# Patient Record
Sex: Female | Born: 1970
Health system: Southern US, Community
[De-identification: ages and names within clinical notes are randomized; demographics above are authoritative.]

## PROBLEM LIST (undated history)

## (undated) DIAGNOSIS — F909 Attention-deficit hyperactivity disorder, unspecified type: Secondary | ICD-10-CM

## (undated) DIAGNOSIS — K219 Gastro-esophageal reflux disease without esophagitis: Secondary | ICD-10-CM

## (undated) DIAGNOSIS — R5383 Other fatigue: Secondary | ICD-10-CM

## (undated) DIAGNOSIS — R002 Palpitations: Secondary | ICD-10-CM

## (undated) DIAGNOSIS — G43809 Other migraine, not intractable, without status migrainosus: Secondary | ICD-10-CM

## (undated) DIAGNOSIS — R569 Unspecified convulsions: Secondary | ICD-10-CM

## (undated) HISTORY — DX: Other fatigue: R53.83

## (undated) HISTORY — DX: Palpitations: R00.2

## (undated) HISTORY — DX: Attention-deficit hyperactivity disorder, unspecified type: F90.9

## (undated) HISTORY — DX: Gastro-esophageal reflux disease without esophagitis: K21.9

## (undated) HISTORY — DX: Unspecified convulsions: R56.9

---

## 1999-05-28 ENCOUNTER — Emergency Department (HOSPITAL_COMMUNITY): Admission: EM | Admit: 1999-05-28 | Discharge: 1999-05-28 | Payer: Self-pay | Admitting: *Deleted

## 2002-06-08 ENCOUNTER — Other Ambulatory Visit: Admission: RE | Admit: 2002-06-08 | Discharge: 2002-06-08 | Payer: Self-pay | Admitting: Obstetrics & Gynecology

## 2002-12-08 ENCOUNTER — Other Ambulatory Visit: Admission: RE | Admit: 2002-12-08 | Discharge: 2002-12-08 | Payer: Self-pay | Admitting: Obstetrics & Gynecology

## 2004-11-22 ENCOUNTER — Ambulatory Visit (HOSPITAL_COMMUNITY): Admission: RE | Admit: 2004-11-22 | Discharge: 2004-11-22 | Payer: Self-pay | Admitting: Family Medicine

## 2010-04-21 ENCOUNTER — Encounter: Payer: Self-pay | Admitting: Obstetrics & Gynecology

## 2010-04-21 ENCOUNTER — Encounter: Payer: Self-pay | Admitting: Family Medicine

## 2010-04-22 ENCOUNTER — Encounter: Payer: Self-pay | Admitting: Family Medicine

## 2010-04-29 ENCOUNTER — Inpatient Hospital Stay (HOSPITAL_COMMUNITY)
Admission: AD | Admit: 2010-04-29 | Discharge: 2010-04-29 | Payer: Self-pay | Source: Home / Self Care | Attending: Obstetrics and Gynecology | Admitting: Obstetrics and Gynecology

## 2010-05-01 ENCOUNTER — Inpatient Hospital Stay (HOSPITAL_COMMUNITY)
Admission: AD | Admit: 2010-05-01 | Discharge: 2010-05-04 | DRG: 774 | Disposition: A | Discharge status: Home / Self Care | Payer: Managed Care, Other (non HMO) | Source: Ambulatory Visit | Attending: Obstetrics & Gynecology | Admitting: Obstetrics & Gynecology

## 2010-05-01 DIAGNOSIS — O3660X0 Maternal care for excessive fetal growth, unspecified trimester, not applicable or unspecified: Secondary | ICD-10-CM | POA: Diagnosis present

## 2010-05-01 DIAGNOSIS — O9903 Anemia complicating the puerperium: Secondary | ICD-10-CM | POA: Diagnosis not present

## 2010-05-01 DIAGNOSIS — D62 Acute posthemorrhagic anemia: Secondary | ICD-10-CM | POA: Diagnosis not present

## 2010-05-01 DIAGNOSIS — O480 Post-term pregnancy: Principal | ICD-10-CM | POA: Diagnosis present

## 2010-05-01 LAB — CBC
HCT: 31.5 % — ABNORMAL LOW (ref 36.0–46.0)
MCH: 31.1 pg (ref 26.0–34.0)
MCV: 89 fL (ref 78.0–100.0)
WBC: 8.5 10*3/uL (ref 4.0–10.5)

## 2010-05-03 LAB — CBC
HCT: 19.3 % — ABNORMAL LOW (ref 36.0–46.0)
Hemoglobin: 6.6 g/dL — CL (ref 12.0–15.0)
Hemoglobin: 6.7 g/dL — CL (ref 12.0–15.0)
MCH: 31.3 pg (ref 26.0–34.0)
MCH: 31.8 pg (ref 26.0–34.0)
MCHC: 34.2 g/dL (ref 30.0–36.0)
MCHC: 34.4 g/dL (ref 30.0–36.0)
Platelets: 134 10*3/uL — ABNORMAL LOW (ref 150–400)
Platelets: 145 10*3/uL — ABNORMAL LOW (ref 150–400)
RBC: 2.11 MIL/uL — ABNORMAL LOW (ref 3.87–5.11)
RDW: 14.4 % (ref 11.5–15.5)
WBC: 12.3 10*3/uL — ABNORMAL HIGH (ref 4.0–10.5)

## 2010-05-04 LAB — CBC
Hemoglobin: 8 g/dL — ABNORMAL LOW (ref 12.0–15.0)
Platelets: 141 10*3/uL — ABNORMAL LOW (ref 150–400)
RDW: 16.1 % — ABNORMAL HIGH (ref 11.5–15.5)

## 2010-05-05 LAB — CROSSMATCH: Unit division: 0

## 2010-05-06 ENCOUNTER — Encounter (HOSPITAL_COMMUNITY)
Admission: RE | Admit: 2010-05-06 | Discharge: 2010-05-06 | Disposition: A | Discharge status: Home / Self Care | Payer: Self-pay | Source: Ambulatory Visit

## 2010-05-06 ENCOUNTER — Encounter (HOSPITAL_COMMUNITY)
Admission: RE | Admit: 2010-05-06 | Discharge: 2010-05-06 | Disposition: A | Discharge status: Home / Self Care | Payer: Self-pay | Source: Ambulatory Visit | Attending: Obstetrics & Gynecology | Admitting: Obstetrics & Gynecology

## 2010-05-06 DIAGNOSIS — O923 Agalactia: Secondary | ICD-10-CM | POA: Insufficient documentation

## 2010-05-10 ENCOUNTER — Ambulatory Visit (HOSPITAL_COMMUNITY): Payer: Managed Care, Other (non HMO) | Attending: Obstetrics & Gynecology

## 2012-04-23 ENCOUNTER — Other Ambulatory Visit: Payer: Self-pay | Admitting: Family Medicine

## 2012-04-23 ENCOUNTER — Ambulatory Visit
Admission: RE | Admit: 2012-04-23 | Discharge: 2012-04-23 | Disposition: A | Discharge status: Home / Self Care | Payer: Managed Care, Other (non HMO) | Source: Ambulatory Visit | Attending: Family Medicine | Admitting: Family Medicine

## 2012-04-23 DIAGNOSIS — M79605 Pain in left leg: Secondary | ICD-10-CM

## 2012-04-23 DIAGNOSIS — M25552 Pain in left hip: Secondary | ICD-10-CM

## 2012-04-23 DIAGNOSIS — M545 Low back pain: Secondary | ICD-10-CM

## 2013-09-03 ENCOUNTER — Emergency Department (HOSPITAL_COMMUNITY): Payer: Managed Care, Other (non HMO)

## 2013-09-03 ENCOUNTER — Encounter (HOSPITAL_COMMUNITY): Payer: Self-pay | Admitting: Emergency Medicine

## 2013-09-03 ENCOUNTER — Emergency Department (HOSPITAL_COMMUNITY)
Admission: EM | Admit: 2013-09-03 | Discharge: 2013-09-03 | Disposition: A | Discharge status: Home / Self Care | Payer: Managed Care, Other (non HMO) | Attending: Emergency Medicine | Admitting: Emergency Medicine

## 2013-09-03 DIAGNOSIS — R42 Dizziness and giddiness: Secondary | ICD-10-CM

## 2013-09-03 DIAGNOSIS — R112 Nausea with vomiting, unspecified: Secondary | ICD-10-CM | POA: Insufficient documentation

## 2013-09-03 DIAGNOSIS — R51 Headache: Secondary | ICD-10-CM | POA: Insufficient documentation

## 2013-09-03 DIAGNOSIS — J3489 Other specified disorders of nose and nasal sinuses: Secondary | ICD-10-CM | POA: Insufficient documentation

## 2013-09-03 DIAGNOSIS — R55 Syncope and collapse: Secondary | ICD-10-CM | POA: Insufficient documentation

## 2013-09-03 DIAGNOSIS — R1110 Vomiting, unspecified: Secondary | ICD-10-CM

## 2013-09-03 DIAGNOSIS — Z3202 Encounter for pregnancy test, result negative: Secondary | ICD-10-CM | POA: Insufficient documentation

## 2013-09-03 DIAGNOSIS — R404 Transient alteration of awareness: Secondary | ICD-10-CM | POA: Insufficient documentation

## 2013-09-03 DIAGNOSIS — Z8679 Personal history of other diseases of the circulatory system: Secondary | ICD-10-CM | POA: Insufficient documentation

## 2013-09-03 HISTORY — DX: Other migraine, not intractable, without status migrainosus: G43.809

## 2013-09-03 LAB — COMPREHENSIVE METABOLIC PANEL
ALK PHOS: 59 U/L (ref 39–117)
ALT: 40 U/L — ABNORMAL HIGH (ref 0–35)
AST: 38 U/L — ABNORMAL HIGH (ref 0–37)
Albumin: 3.8 g/dL (ref 3.5–5.2)
BUN: 7 mg/dL (ref 6–23)
CALCIUM: 9.6 mg/dL (ref 8.4–10.5)
CO2: 27 mEq/L (ref 19–32)
CREATININE: 0.59 mg/dL (ref 0.50–1.10)
Chloride: 99 mEq/L (ref 96–112)
GFR calc non Af Amer: >90 mL/min (ref >90)
GLUCOSE: 106 mg/dL — AB (ref 70–99)
Potassium: 3.8 mEq/L (ref 3.7–5.3)
SODIUM: 138 meq/L (ref 137–147)
TOTAL PROTEIN: 7.8 g/dL (ref 6.0–8.3)
Total Bilirubin: 0.3 mg/dL (ref 0.3–1.2)

## 2013-09-03 LAB — CBC WITH DIFFERENTIAL/PLATELET
BASOS PCT: 1 % (ref 0–1)
Basophils Absolute: 0.1 10*3/uL (ref 0.0–0.1)
EOS PCT: 1 % (ref 0–5)
Eosinophils Absolute: 0.1 10*3/uL (ref 0.0–0.7)
HEMATOCRIT: 36.1 % (ref 36.0–46.0)
Hemoglobin: 12 g/dL (ref 12.0–15.0)
LYMPHS ABS: 3 10*3/uL (ref 0.7–4.0)
Lymphocytes Relative: 35 % (ref 12–46)
MCH: 28.9 pg (ref 26.0–34.0)
MCHC: 33.2 g/dL (ref 30.0–36.0)
MCV: 87 fL (ref 78.0–100.0)
Monocytes Absolute: 0.6 10*3/uL (ref 0.1–1.0)
Monocytes Relative: 7 % (ref 3–12)
Neutro Abs: 4.8 10*3/uL (ref 1.7–7.7)
Neutrophils Relative %: 56 % (ref 43–77)
PLATELETS: 250 10*3/uL (ref 150–400)
RBC: 4.15 MIL/uL (ref 3.87–5.11)
RDW: 13.8 % (ref 11.5–15.5)
WBC: 8.6 10*3/uL (ref 4.0–10.5)

## 2013-09-03 LAB — TROPONIN I: Troponin I: <0.3 ng/mL (ref <0.30)

## 2013-09-03 LAB — POC URINE PREG, ED: PREG TEST UR: NEGATIVE

## 2013-09-03 MED ORDER — ONDANSETRON 4 MG PO TBDP: ORAL_TABLET | ORAL | Status: DC

## 2013-09-03 MED ORDER — MECLIZINE HCL 25 MG PO TABS: 25.0000 mg | ORAL_TABLET | Freq: Three times a day (TID) | ORAL | Status: DC | PRN

## 2013-09-03 MED ORDER — ONDANSETRON HCL 4 MG/2ML IJ SOLN
4.0000 mg | Freq: Once | INTRAMUSCULAR | Status: AC
  Administered 2013-09-03: 4 mg via INTRAVENOUS
  Filled 2013-09-03: qty 2

## 2013-09-03 MED ORDER — SODIUM CHLORIDE 0.9 % IV BOLUS (SEPSIS)
1000.0000 mL | INTRAVENOUS | Status: AC
  Administered 2013-09-03: 1000 mL via INTRAVENOUS

## 2013-09-03 NOTE — ED Notes (Signed)
Patient vomiting as she came into exam room with husband.  IV started and fluids hanging.  Dr. Romeo Apple at bedside.  Labs obtained.

## 2013-09-03 NOTE — ED Notes (Signed)
Patient has extreme Ha and Lightheadedness. The patient went to dr. The patient MHA and sinus infection. The patient now is worse.

## 2013-09-03 NOTE — ED Provider Notes (Signed)
CSN: [id number]     Arrival date & time 09/03/13  1254 History   First MD Initiated Contact with Patient 09/03/13 1322     Chief Complaint  Patient presents with  . Dizziness  . Loss of Consciousness     (Consider location/radiation/quality/duration/timing/severity/associated sxs/prior Treatment) Patient is a 43 y.o. female presenting with dizziness and syncope. The history is provided by the patient.  Dizziness Quality:  Unable to specify Severity:  Moderate Onset quality:  Sudden Timing:  Rare Progression:  Unchanged Chronicity:  New Context comment:  Worse w/ standing Relieved by:  Lying down and being still Worsened by:  Standing up Ineffective treatments:  None tried Associated symptoms: headaches, nausea, syncope and vomiting   Associated symptoms: no chest pain, no diarrhea and no shortness of breath   Loss of Consciousness Associated symptoms: dizziness, headaches, nausea and vomiting   Associated symptoms: no chest pain, no fever and no shortness of breath     Past Medical History  Diagnosis Date  . Lower half migraine    History reviewed. No pertinent past surgical history. No family history on file. History  Substance Use Topics  . Smoking status: Never Smoker   . Smokeless tobacco: Not on file  . Alcohol Use: No   OB History   Grav Para Term Preterm Abortions TAB SAB Ect Mult Living                 Review of Systems  Constitutional: Negative for fever and fatigue.  HENT: Positive for congestion. Negative for drooling.   Eyes: Negative for pain.  Respiratory: Negative for cough and shortness of breath.   Cardiovascular: Positive for syncope. Negative for chest pain.  Gastrointestinal: Positive for nausea and vomiting. Negative for abdominal pain and diarrhea.  Genitourinary: Negative for dysuria and hematuria.  Musculoskeletal: Negative for back pain, gait problem and neck pain.  Skin: Negative for color change.  Neurological: Positive for dizziness  and headaches.  Hematological: Negative for adenopathy.  Psychiatric/Behavioral: Negative for behavioral problems.  All other systems reviewed and are negative.     Allergies  Review of patient's allergies indicates no known allergies.  Home Medications   Prior to Admission medications   Not on File   BP 105/65  Pulse 61  Temp(Src) 97.8 F (36.6 C)  Resp 20  SpO2 100% Physical Exam  Nursing note and vitals reviewed. Constitutional: She is oriented to person, place, and time. She appears well-developed and well-nourished.  HENT:  Head: Normocephalic.  Mouth/Throat: Oropharynx is clear and moist. No oropharyngeal exudate.  Eyes: Conjunctivae and EOM are normal. Pupils are equal, round, and reactive to light.  Neck: Normal range of motion. Neck supple.  Cardiovascular: Normal rate, regular rhythm, normal heart sounds and intact distal pulses.  Exam reveals no gallop and no friction rub.   No murmur heard. Pulmonary/Chest: Effort normal and breath sounds normal. No respiratory distress. She has no wheezes.  Abdominal: Soft. Bowel sounds are normal. There is no tenderness. There is no rebound and no guarding.  Musculoskeletal: Normal range of motion. She exhibits no edema and no tenderness.  Neurological: She is alert and oriented to person, place, and time.  alert, oriented x3 speech: normal in context and clarity memory: intact grossly cranial nerves II-XII: intact motor strength: full proximally and distally no involuntary movements or tremors sensation: intact to light touch diffusely  cerebellar: finger-to-nose and heel-to-shin intact gait: normal forwards and backwards, normal tandem gait   Skin: Skin is warm  and dry.  Psychiatric: She has a normal mood and affect. Her behavior is normal.    ED Course  Procedures (including critical care time) Labs Review Labs Reviewed  COMPREHENSIVE METABOLIC PANEL - Abnormal; Notable for the following:    Glucose, Bld 106 (*)     AST 38 (*)    ALT 40 (*)    All other components within normal limits  CBC WITH DIFFERENTIAL  TROPONIN I  POC URINE PREG, ED    Imaging Review Dg Chest 2 View  09/03/2013   CLINICAL DATA:  Syncope.  EXAM: CHEST  2 VIEW  COMPARISON:  None.  FINDINGS: The heart size and mediastinal contours are within normal limits. Both lungs are clear. The visualized skeletal structures are unremarkable.  IMPRESSION: No active cardiopulmonary disease.   Electronically Signed   By: Myles RosenthalJohn  Stahl M.D.   On: 09/03/2013 14:45   Ct Head Wo Contrast  09/03/2013   CLINICAL DATA:  Dizziness and loss of consciousness ; headache  EXAM: CT HEAD WITHOUT CONTRAST  TECHNIQUE: Contiguous axial images were obtained from the base of the skull through the vertex without intravenous contrast. Study was obtained within 24 hr of patient's arrival at the emergency department.  COMPARISON:  None.  FINDINGS: The ventricles are normal in size and configuration. There is no mass, hemorrhage, extra-axial fluid collection, or midline shift. There is no gray-white compartment lesion apparent. No acute infarct apparent. The bony calvarium appears intact. The mastoid air cells are clear.  IMPRESSION: Study within normal limits.   Electronically Signed   By: Bretta BangWilliam  Woodruff M.D.   On: 09/03/2013 14:43     EKG Interpretation   Date/Time:  Saturday September 03 2013 14:11:17 EDT Ventricular Rate:  68 PR Interval:  176 QRS Duration: 84 QT Interval:  397 QTC Calculation: 422 R Axis:   78 Text Interpretation:  Sinus rhythm inverted t wave in V2 which is  non-specific Confirmed by Harlow Basley  MD, Carlous Olivares ([id number]) on 09/03/2013 2:20:59  PM      MDM   Final diagnoses:  Near syncope  Vomiting  Dizziness    2:15 PM 43 y.o. female who presents with a near syncopal episode which occurred at approximately 9 AM this morning. She states that she developed a subjective fever and headache last weekend (7 days ago). She notes that she has a history of  headaches and this was consistent with previous headaches. She states she had a temperature of 99. She also had some nasal congestion. She saw her primary care provider for 5 days ago who diagnosed her with sinusitis and started her on amoxicillin as well as an antihistamine. She notes that she had started to feel better over the last few days. Last night she had a few episodes of dizziness and emesis which recurred this morning. She notes that she has had intermittent episodes of dizziness and nausea over the last few months. She called out for her husband this morning who witnessed her have a near syncopal episode lasting only 1-2 minutes. The patient is afebrile and vital signs are unremarkable here. She denies any dizziness currently but states standing makes the dizziness slightly worse. She has a normal neurologic exam. Will get screening labs and imaging. Will get CT of head do to intermittent dizziness over the last few months. My suspicion is that it is likely vertigo of a peripheral etiology.   3:24 PM: Pt continues to appear well. I interpreted/reviewed the labs and/or imaging which were non-contributory.  Will provide antivert and zofran. I have discussed the diagnosis/risks/treatment options with the patient and believe the pt to be eligible for discharge home to follow-up with pcp as needed, also ENT non-emergently d/t intermittent dizziness. We also discussed returning to the ED immediately if new or worsening sx occur. We discussed the sx which are most concerning (e.g., worsening dizziness, inability to tolerate po, fever) that necessitate immediate return. Medications administered to the patient during their visit and any new prescriptions provided to the patient are listed below.  Medications given during this visit Medications  sodium chloride 0.9 % bolus 1,000 mL (1,000 mLs Intravenous New Bag/Given 09/03/13 1414)  ondansetron (ZOFRAN) injection 4 mg (4 mg Intravenous Given 09/03/13 1359)     New Prescriptions   MECLIZINE (ANTIVERT) 25 MG TABLET    Take 1 tablet (25 mg total) by mouth 3 (three) times daily as needed for dizziness.   ONDANSETRON (ZOFRAN ODT) 4 MG DISINTEGRATING TABLET    4mg  ODT q4 hours prn nausea/vomit       Junius Argyle, MD 09/03/13 1526

## 2013-09-03 NOTE — Discharge Instructions (Signed)
Dizziness ° Dizziness means you feel unsteady or lightheaded. You might feel like you are going to pass out (faint). °HOME CARE  °· Drink enough fluids to keep your pee (urine) clear or pale yellow. °· Take your medicines exactly as told by your doctor. If you take blood pressure medicine, always stand up slowly from the lying or sitting position. Hold on to something to steady yourself. °· If you need to stand in one place for a long time, move your legs often. Tighten and relax your leg muscles. °· Have someone stay with you until you feel okay. °· Do not drive or use heavy machinery if you feel dizzy. °· Do not drink alcohol. °GET HELP RIGHT AWAY IF:  °· You feel dizzy or lightheaded and it gets worse. °· You feel sick to your stomach (nauseous), or you throw up (vomit). °· You have trouble talking or walking. °· You feel weak or have trouble using your arms, hands, or legs. °· You cannot think clearly or have trouble forming sentences. °· You have chest pain, belly (abdominal) pain, sweating, or you are short of breath. °· Your vision changes. °· You are bleeding. °· You have problems from your medicine that seem to be getting worse. °MAKE SURE YOU:  °· Understand these instructions. °· Will watch your condition. °· Will get help right away if you are not doing well or get worse. °Document Released: 03/06/2011 Document Revised: 06/09/2011 Document Reviewed: 03/06/2011 °ExitCare® Patient Information ©2014 [employer]. ° °

## 2013-10-19 ENCOUNTER — Ambulatory Visit (INDEPENDENT_AMBULATORY_CARE_PROVIDER_SITE_OTHER): Payer: Managed Care, Other (non HMO) | Admitting: Neurology

## 2013-10-19 ENCOUNTER — Encounter: Payer: Self-pay | Admitting: Neurology

## 2013-10-19 VITALS — BP 86/61 | HR 71 | Ht 63.5 in | Wt 143.6 lb

## 2013-10-19 DIAGNOSIS — Q048 Other specified congenital malformations of brain: Secondary | ICD-10-CM

## 2013-10-19 DIAGNOSIS — G40219 Localization-related (focal) (partial) symptomatic epilepsy and epileptic syndromes with complex partial seizures, intractable, without status epilepticus: Secondary | ICD-10-CM

## 2013-10-19 DIAGNOSIS — R443 Hallucinations, unspecified: Secondary | ICD-10-CM

## 2013-10-19 DIAGNOSIS — G40909 Epilepsy, unspecified, not intractable, without status epilepticus: Secondary | ICD-10-CM | POA: Insufficient documentation

## 2013-10-19 DIAGNOSIS — F22 Delusional disorders: Secondary | ICD-10-CM

## 2013-10-19 DIAGNOSIS — R440 Auditory hallucinations: Secondary | ICD-10-CM | POA: Insufficient documentation

## 2013-10-19 MED ORDER — LEVETIRACETAM 500 MG PO TABS: 500.0000 mg | ORAL_TABLET | Freq: Two times a day (BID) | ORAL | Status: DC

## 2013-10-19 NOTE — Patient Instructions (Signed)
I had a long discussion with the patient and his wife regarding her recent protracted illness, seizures and personally reviewed imaging studies and answered questions. Continue Keppra for seizures and taper Trileptal because of low sodium as well as paranoid ideation. Repeat MRI scan of the brain with and without contrast and EEG. She was advised not to drive for now. Return for followup in 2 months or call earlier if necessary.  Seizure, Adult A seizure is abnormal electrical activity in the brain. Seizures usually last from 30 seconds to 2 minutes. There are various types of seizures. Before a seizure, you may have a warning sensation (aura) that a seizure is about to occur. An aura may include the following symptoms:   Fear or anxiety.  Nausea.  Feeling like the room is spinning (vertigo).  Vision changes, such as seeing flashing lights or spots. Common symptoms during a seizure include:  A change in attention or behavior (altered mental status).  Convulsions with rhythmic jerking movements.  Drooling.  Rapid eye movements.  Grunting.  Loss of bladder and bowel control.  Bitter taste in the mouth.  Tongue biting. After a seizure, you may feel confused and sleepy. You may also have an injury resulting from convulsions during the seizure. HOME CARE INSTRUCTIONS   If you are given medicines, take them exactly as prescribed by your health care provider.  Keep all follow-up appointments as directed by your health care provider.  Do not swim or drive or engage in risky activity during which a seizure could cause further injury to you or others until your health care provider says it is OK.  Get adequate rest.  Teach friends and family what to do if you have a seizure. They should:  Lay you on the ground to prevent a fall.  Put a cushion under your head.  Loosen any tight clothing around your neck.  Turn you on your side. If vomiting occurs, this helps keep your airway  clear.  Stay with you until you recover.  Know whether or not you need emergency care. SEEK IMMEDIATE MEDICAL CARE IF:  The seizure lasts longer than 5 minutes.  The seizure is severe or you do not wake up immediately after the seizure.  You have an altered mental status after the seizure.  You are having more frequent or worsening seizures. Someone should drive you to the emergency department or call local emergency services (911 in [place] MAKE SURE YOU:  Understand these instructions.  Will watch your condition.  Will get help right away if you are not doing well or get worse. Document Released: 03/14/2000 Document Revised: 01/05/2013 Document Reviewed: 10/27/2012 [facility] LtdExitCare Patient Information 2015 [employer], [place]. This information is not intended to replace advice given to you by your health care provider. Make sure you discuss any questions you have with your health care provider.

## 2013-10-19 NOTE — Progress Notes (Signed)
[facility] [address] [place]. [place] [address] [phone]       OFFICE CONSULT NOTE  [name] Date of Birth:  1970/04/20 Medical Record Number:  [id number]   Referring MD:  Farris Has  Reason for Referral:  seizures  HPI: 29 year [place] lady who recently returned from [place] where she had  hospitalization for a repetitive seizures and abnormal MRI scan of the brain. She is accompanied today by husband and has brought with her extensive hospital records, several MRI scans and lab results which I have  personally reviewed. She started feeling sick   actually the first week of June 2015 several days prior to going to [place]. She was felt to have a sinus infection and treated with Augmentin for sinusitis and Zofran for nausea. She felt tired after this and the day prior to her trip her husband noted that she looked pale and had head was turned to one side and she'll not fully responsive. EMS were called and patient vomited. She was taken to the long emergency room where she was felt to have near syncope and dehydration. She was treated with IV fluids. CT scan of the head, x-rays EKG and lab work were unremarkable. She was discharged on and felt better next morning after taking Sudafed. She traveled to [place] up with felt unwell. She started hearing voices in her head which were talking to each other. She is more able to concentrate and spent time with relatives. She felt better after she rested. Several days later after she had stayed up late in the night the husband noticed that she will have a generalized clonic tonic seizure. She was noted as morning with tossing and turning with teeth clenching. She was taken to the  [facility] hospital where she was diagnosed having seizures and treated with diazepam. She fell asleep. Next-day she was asked to see a neurologist who advised an MRI scan and EEG. She started having speech difficulties and  stopping midsentence    and unable to complete sentences. The patient's husband [name] L4 medial which have looked at in which patient is making vocalizations but not able to form words. Patient was subsequently hospitalized and underwent a spinal tap which was reportedly normal and an MRI scan of the brain which showed weak diffusion positive lesion in the left medial frontal lobe cingulate gyrus. There were no ADC map images.This lesion was also seen on T2 and flair images and   a postcontrast MRI showed a venous angioma adjacent to this lesion. Lesion was felt to represent cortical dysplasia versus postictal changes from repititive seizures.. The patient was initially started on Trileptal and phenytoin but continued to have auditory hallucinations and some speech and word finding difficulties but eventually she was switched to Keppra and she had a miraculous response to this and voices in her head stopped and her speech improved and she's not had any further seizures since then . She however has had some paranoid ideation and is having thoughts of fears of separation from her family and thoughts like ironing clothes we'll start a fire. She is currently taking Keppra 500 twice daily and Trileptal 300 twice daily. She had lab work done on 10/18/13 which showed low sodium of 133 but normal WBC and electrolytes and liver functions otherwise.  ROS:   14 system review of systems is positive for fatigue, ringing in ears, spinning sensation, birthmarks, blurred vision, feeling hot and cold, confusion, memory loss, weakness, slurred  speech, dizziness, depression, anxiety, disinterest in activities, hallucinations.  PMH:  Past Medical History  Diagnosis Date  . Lower half migraine   . Seizures     Social History:  History   Social History  . Marital Status: Married    Spouse Name: N/A    Number of Children: 2  . Years of Education: degree   Occupational History  . school    Social History Main Topics  . Smoking status:  Never Smoker   . Smokeless tobacco: Not on file  . Alcohol Use: No  . Drug Use: No  . Sexual Activity: Yes   Other Topics Concern  . Not on file   Social History Narrative   Patient lives with her husband and 2 children   Patient is right handed    Patient drunks tea    Medications:   No current outpatient prescriptions on file prior to visit.   No current facility-administered medications on file prior to visit.    Allergies:   Allergies  Allergen Reactions  . Levaquin [Levofloxacin] Nausea And Vomiting  . Nexium [Esomeprazole] Nausea And Vomiting    Dizziness.     Physical Exam General: well developed, well nourished young [place] lady, seated, in no evident distress Head: head normocephalic and atraumatic. Orohparynx benign Neck: supple with no carotid or supraclavicular bruits Cardiovascular: regular rate and rhythm, no murmurs Musculoskeletal: no deformity Skin:  no rash/petichiae Vascular:  Normal pulses all extremities Filed Vitals:   10/19/13 1347  BP: 86/61  Pulse: 71    Neurologic Exam Mental Status: Awake and fully alert. Oriented to place and time. Recent and remote memory intact. Recall 3/3. Intact calculation. Attention span, concentration and fund of knowledge appropriate. Mood and affect appropriate. No paranoid ideation noted Cranial Nerves: Fundoscopic exam reveals sharp disc margins. Pupils equal, briskly reactive to light. Extraocular movements full without nystagmus. Visual fields full to confrontation. Hearing intact. Facial sensation intact. Face, tongue, palate moves normally and symmetrically.  Motor: Normal bulk and tone. Normal strength in all tested extremity muscles. Sensory.: intact to tough and pinprick and vibratory.  Coordination: Rapid alternating movements normal in all extremities. Finger-to-nose and heel-to-shin performed accurately bilaterally. Gait and Station: Arises from chair without difficulty. Stance is normal.  Gait demonstrates normal stride length and balance . Able to heel, toe and tandem walk without difficulty.  Reflexes: 1+ and symmetric. Toes downgoing.      ASSESSMENT: 64 year [place] lady with recent episodes of complex partial, simple partial and partial onset seizures with secondary generalization in June 2015 symptomatic from left frontal lesion etiology indeterminate. 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 Seizures seem now controlled but she is having some paranoid ideation and low sodium levels which may be related to Trileptal    PLAN: I had a long discussion with the patient and his wife regarding her recent protracted illness, seizures and personally reviewed imaging studies and answered questions. Continue Keppra for seizures and taper Trileptal because of low sodium as well as paranoid ideation. Repeat MRI scan of the brain with and without contrast and EEG. She was advised not to drive for now. Return for followup in 2 months or call earlier if necessary. The duration of this appointment visit was 60 minutes of face-to-face time with the patient. Greater than 50% of this time was spent in counseling, explanation of diagnosis,extensive review of available records, imaging studies, planning of further  management, and coordination of care.      Note: This document was prepared with digital dictation and possible smart phrase technology. Any transcriptional errors that result from this process are unintentional.

## 2013-11-08 ENCOUNTER — Telehealth: Payer: Self-pay | Admitting: Neurology

## 2013-11-08 NOTE — Telephone Encounter (Signed)
Patient calling to get clarification about whether she needs to have her MRI sooner or if she can wait, please return call to patient and advise.

## 2013-11-09 ENCOUNTER — Telehealth: Payer: Self-pay | Admitting: Neurology

## 2013-11-09 NOTE — Telephone Encounter (Signed)
Called patient and relayed with consult from Dr. Roda ShuttersXu on how to come down off the medication the patient was on. Trileptal and i also told them to have the mri done in to the middle of september

## 2013-11-09 NOTE — Telephone Encounter (Signed)
Andrea Berg calling to state that she wants a call to discuss whether she can wait to have her MRI closer to her follow up on 12/30/13 or if Dr. Pearlean BrownieSethi wants her to have it as soon as possible. Please return call to Andrea Berg and advise.

## 2013-11-09 NOTE — Telephone Encounter (Signed)
MRI has been authorized. Renee left message for patient to call to schedule.

## 2013-12-07 ENCOUNTER — Other Ambulatory Visit: Payer: Managed Care, Other (non HMO)

## 2013-12-08 ENCOUNTER — Encounter (INDEPENDENT_AMBULATORY_CARE_PROVIDER_SITE_OTHER): Payer: Self-pay

## 2013-12-08 ENCOUNTER — Ambulatory Visit (INDEPENDENT_AMBULATORY_CARE_PROVIDER_SITE_OTHER): Payer: Managed Care, Other (non HMO)

## 2013-12-08 DIAGNOSIS — G40219 Localization-related (focal) (partial) symptomatic epilepsy and epileptic syndromes with complex partial seizures, intractable, without status epilepticus: Secondary | ICD-10-CM

## 2013-12-08 MED ORDER — GADOPENTETATE DIMEGLUMINE 469.01 MG/ML IV SOLN: 14.0000 mL | Freq: Once | INTRAVENOUS | Status: AC | PRN

## 2013-12-22 ENCOUNTER — Telehealth: Payer: Self-pay | Admitting: Neurology

## 2013-12-22 NOTE — Telephone Encounter (Signed)
MRI shows no major findings. Probably stable DVA. -VRP

## 2013-12-22 NOTE — Telephone Encounter (Signed)
Please call back with MRI results...best contact is [phone] (M)

## 2013-12-22 NOTE — Telephone Encounter (Signed)
Patient calling for MRI results.  Please call @ [phone] anytime and may leave detailed message on voicemail.

## 2013-12-22 NOTE — Telephone Encounter (Signed)
I called and left a message on the patient's husband's cell phone regarding stable appearance of followup MRI scan and to keep scheduled followup appointment with me

## 2013-12-26 ENCOUNTER — Other Ambulatory Visit: Payer: Managed Care, Other (non HMO) | Admitting: Radiology

## 2013-12-28 ENCOUNTER — Other Ambulatory Visit (INDEPENDENT_AMBULATORY_CARE_PROVIDER_SITE_OTHER): Payer: Managed Care, Other (non HMO) | Admitting: Radiology

## 2013-12-28 DIAGNOSIS — G40219 Localization-related (focal) (partial) symptomatic epilepsy and epileptic syndromes with complex partial seizures, intractable, without status epilepticus: Secondary | ICD-10-CM

## 2013-12-30 ENCOUNTER — Encounter: Payer: Self-pay | Admitting: Neurology

## 2013-12-30 ENCOUNTER — Ambulatory Visit (INDEPENDENT_AMBULATORY_CARE_PROVIDER_SITE_OTHER): Payer: Managed Care, Other (non HMO) | Admitting: Neurology

## 2013-12-30 VITALS — BP 100/60 | HR 76 | Ht 63.5 in | Wt 147.2 lb

## 2013-12-30 DIAGNOSIS — G40109 Localization-related (focal) (partial) symptomatic epilepsy and epileptic syndromes with simple partial seizures, not intractable, without status epilepticus: Secondary | ICD-10-CM

## 2013-12-30 NOTE — Progress Notes (Signed)
[facility] [address] [place] [place] [address] [phone]       OFFICE FOLLOW UP VISIT NOTE  [name] Date of Birth:  07/07/1970 Medical Record Number:  [id number]   Referring MD:  London Pepper  Reason for Referral:  seizures  HPI: 45 year [place] lady who recently returned from [place] where she had  hospitalization for a repetitive seizures and abnormal MRI scan of the brain. She is accompanied today by husband and has brought with her extensive hospital records, several MRI scans and lab results which I have  personally reviewed. She started feeling sick   actually the first week of June 2015 several days prior to going to [place]. She was felt to have a sinus infection and treated with Augmentin for sinusitis and Zofran for nausea. She felt tired after this and the day prior to her trip her husband noted that she looked pale and had head was turned to one side and she'll not fully responsive. EMS were called and patient vomited. She was taken to the long emergency room where she was felt to have near syncope and dehydration. She was treated with IV fluids. CT scan of the head, x-rays EKG and lab work were unremarkable. She was discharged on and felt better next morning after taking Sudafed. She traveled to [place] up with felt unwell. She started hearing voices in her head which were talking to each other. She is more able to concentrate and spent time with relatives. She felt better after she rested. Several days later after she had stayed up late in the night the husband noticed that she will have a generalized clonic tonic seizure. She was noted as morning with tossing and turning with teeth clenching. She was taken to the  [facility] hospital where she was diagnosed having seizures and treated with diazepam. She fell asleep. Next-day she was asked to see a neurologist who advised an MRI scan and EEG. She started having speech difficulties and  stopping  midsentence   and unable to complete sentences. The patient's husband [name] L4 medial which have looked at in which patient is making vocalizations but not able to form words. Patient was subsequently hospitalized and underwent a spinal tap which was reportedly normal and an MRI scan of the brain which showed weak diffusion positive lesion in the left medial frontal lobe cingulate gyrus. There were no ADC map images.This lesion was also seen on T2 and flair images and   a postcontrast MRI showed a venous angioma adjacent to this lesion. Lesion was felt to represent cortical dysplasia versus postictal changes from repititive seizures.. The patient was initially started on Trileptal and phenytoin but continued to have auditory hallucinations and some speech and word finding difficulties but eventually she was switched to [place] and she had a miraculous response to this and voices in her head stopped and her speech improved and she's not had any further seizures since then . She however has had some paranoid ideation and is having thoughts of fears of separation from her family and thoughts like ironing clothes we'll start a fire. She is currently taking Keppra 500 twice daily and Trileptal 300 twice daily. She had lab work done on 10/18/13 which showed low sodium of 133 but normal WBC and electrolytes and liver functions otherwise. Update 12/30/2013 : She returns for followup after last visit 3 months ago. She is doing well and has not had any recurrent seizures. She has tapered and  discontinued Trileptal. She is currently on Keppra 500 twice daily. She does complain of some excessive sleepiness and tiredness. She is continues to have mild short-term memory and cognitive difficulties but some of these 3 seated her seizures. Husband is concerned that at times she forgets recent conversations and events. She had EEG done which I have personally reviewed and was normal. A followup MRI scan of the brain done on 12/08/13  personally reviewed shows left frontal small venous angioma without evidence of significant hemorrhage. The left cingulate gyrus swelling and cytotoxic edema have completely resolved compared with previous MRI from June 2015 from [place] ROS:   14 system review of systems is positive for fatigue, chills, cold or heat intolerance, excessive thirst, excessive eating, abdominal pain, cough, wheezing, shortness of breath, trouble swallowing, chest pain and leg swelling, frequency or urination, memory loss, dizziness, headache, seizure, tremor, decreased concentration, anxiety, depression, muscle cramps, walking difficulty, frequent waking, daytime sleepiness, apnea and rash. PMH:  Past Medical History  Diagnosis Date  . Lower half migraine   . Seizures     Social History:  History   Social History  . Marital Status: Married    Spouse Name: N/A    Number of Children: 2  . Years of Education: degree   Occupational History  . school    Social History Main Topics  . Smoking status: Never Smoker   . Smokeless tobacco: Not on file  . Alcohol Use: No  . Drug Use: No  . Sexual Activity: Yes   Other Topics Concern  . Not on file   Social History Narrative   Patient lives with her husband and 2 children   Patient is right handed    Patient drunks tea    Medications:   Current Outpatient Prescriptions on File Prior to Visit  Medication Sig Dispense Refill  . levETIRAcetam (KEPPRA) 1000 MG tablet Take 500 mg by mouth 2 (two) times daily.      Marland Kitchen levETIRAcetam (KEPPRA) 500 MG tablet Take 1 tablet (500 mg total) by mouth 2 (two) times daily.  60 tablet  3   No current facility-administered medications on file prior to visit.    Allergies:   Allergies  Allergen Reactions  . Levaquin [Levofloxacin] Nausea And Vomiting  . Nexium [Esomeprazole] Nausea And Vomiting    Dizziness.     Physical Exam General: well developed, well nourished young [place] lady, seated, in no  evident distress Head: head normocephalic and atraumatic.   Neck: supple with no carotid or supraclavicular bruits Cardiovascular: regular rate and rhythm, no murmurs Musculoskeletal: no deformity Skin:  no rash/petichiae Vascular:  Normal pulses all extremities Filed Vitals:   12/30/13 1011  BP: 100/60  Pulse: 76    Neurologic Exam Mental Status: Awake and fully alert. Oriented to place and time. Recent and remote memory intact. Recall 3/3. Intact calculation. Attention span, concentration and fund of knowledge appropriate. Mood and affect appropriate. No paranoid ideation noted. Mini-Mental Status exam score 27/30 with 2 deficits in orientation and one in the petition. Animal fluency test 15 which is normal. Genetic depression scale 3 which is not suggestive of depression. Cochran 4/4. Cranial Nerves: Fundoscopic exam reveals sharp disc margins. Pupils equal, briskly reactive to light. Extraocular movements full without nystagmus. Visual fields full to confrontation. Hearing intact. Facial sensation intact. Face, tongue, palate moves normally and symmetrically.  Motor: Normal bulk and tone. Normal strength in all tested extremity muscles. Sensory.: intact to tough and pinprick and  vibratory.  Coordination: Rapid alternating movements normal in all extremities. Finger-to-nose and heel-to-shin performed accurately bilaterally. Gait and Station: Arises from chair without difficulty. Stance is normal. Gait demonstrates normal stride length and balance . Able to heel, toe and tandem walk without difficulty.  Reflexes: 1+ and symmetric. Toes downgoing.      ASSESSMENT: [age] year [place] lady with recent episodes of complex partial, simple partial and partial onset seizures with secondary generalization in June 2015 symptomatic from left frontal lesion etiology indeterminate. Adjacent venous angioma with  post ictal MRI changes from repetitive seizures. Seizures seem now controlled     PLAN: I had a long discussion with the patient and his wife and husband regarding her seizures, discussed and personally reviewed her brain followup imaging studies, compared it with the prior scans and EEG findings and answered their questions. The previously described left cingulate gyrus swelling on prior MRI was likely a ictal phenomena and seems to have resolved. I suspect the seizures were caused by her left frontal venous angioma with a small bleed which now appears stable Continue Keppra 500 twice daily for now. She may start driving in 2 weeks from now. I encouraged her to avoid seizure to quitting stimuli. Return for followup in 2 months or call earlier if necessary       Note: This document was prepared with digital dictation and possible smart phrase technology. Any transcriptional errors that result from this process are unintentional.

## 2013-12-30 NOTE — Patient Instructions (Signed)
I had a long discussion with the patient and his wife and husband regarding her seizures, discussed and personally reviewed her brain followup imaging studies, compared it with the prior scans and EEG findings and answered their questions. The previously described left cingulate gyrus swelling on prior MRI was likely a ictal phenomena and seems to have resolved. I suspect the seizures were caused by her left frontal venous angioma with a small bleed which now appears stable Continue Keppra 500 twice daily for now. She may start driving in 2 weeks from now. I encouraged her to avoid seizure to quitting stimuli. Return for followup in 2 months or call earlier if necessary

## 2014-01-18 ENCOUNTER — Telehealth: Payer: Self-pay | Admitting: Neurology

## 2014-01-18 NOTE — Telephone Encounter (Signed)
Ok to do both 

## 2014-01-18 NOTE — Telephone Encounter (Signed)
Should patient take flu shot. Please advise.

## 2014-01-18 NOTE — Telephone Encounter (Signed)
Patient questioning if she could take flu shot?  Can she take over the counter pain medication?  Please call anytime and may leave detailed message on voicemail.

## 2014-01-20 NOTE — Telephone Encounter (Signed)
Spoke with patient and informed her that per Dr Pearlean BrownieSethi it is okay for her to get the flu shot and take OTC pain medication. Patient verbalized understanding and questioned if it is okay to take Nasonex and Asteline.

## 2014-01-20 NOTE — Telephone Encounter (Signed)
Yes for both. 

## 2014-01-26 NOTE — Telephone Encounter (Signed)
Called patient and informed her that per Dr Pearlean BrownieSethi it is okay for patient to use Nasonex and Astelin.

## 2014-05-04 ENCOUNTER — Ambulatory Visit: Payer: Managed Care, Other (non HMO) | Admitting: Neurology

## 2014-05-04 ENCOUNTER — Telehealth: Payer: Self-pay | Admitting: Neurology

## 2014-05-04 MED ORDER — LEVETIRACETAM 500 MG PO TABS: 500.0000 mg | ORAL_TABLET | Freq: Two times a day (BID) | ORAL | Status: DC

## 2014-05-04 NOTE — Telephone Encounter (Signed)
Pharmacy sent us the refill request today at 10:25 am.  Rx has been sent.

## 2014-05-04 NOTE — Telephone Encounter (Signed)
Patient is calling to get a refill called in [facility] [address]. Patient states the pharmacy should have sent in a request. Thank you.

## 2014-08-11 ENCOUNTER — Ambulatory Visit (INDEPENDENT_AMBULATORY_CARE_PROVIDER_SITE_OTHER): Payer: Managed Care, Other (non HMO) | Admitting: Neurology

## 2014-08-11 ENCOUNTER — Encounter: Payer: Self-pay | Admitting: Neurology

## 2014-08-11 VITALS — BP 95/64 | HR 62 | Wt 150.2 lb

## 2014-08-11 DIAGNOSIS — G40109 Localization-related (focal) (partial) symptomatic epilepsy and epileptic syndromes with simple partial seizures, not intractable, without status epilepticus: Secondary | ICD-10-CM | POA: Diagnosis not present

## 2014-08-11 MED ORDER — LEVETIRACETAM 500 MG PO TABS: 500.0000 mg | ORAL_TABLET | Freq: Two times a day (BID) | ORAL | Status: DC

## 2014-08-11 NOTE — Progress Notes (Signed)
[facility] [address] [place] [place] [address] [phone]       OFFICE FOLLOW UP VISIT NOTE  [name] Date of Birth:  07/07/1970 Medical Record Number:  [id number]   Referring MD:  London Pepper  Reason for Referral:  seizures  HPI: 45 year [place] lady who recently returned from [place] where she had  hospitalization for a repetitive seizures and abnormal MRI scan of the brain. She is accompanied today by husband and has brought with her extensive hospital records, several MRI scans and lab results which I have  personally reviewed. She started feeling sick   actually the first week of June 2015 several days prior to going to [place]. She was felt to have a sinus infection and treated with Augmentin for sinusitis and Zofran for nausea. She felt tired after this and the day prior to her trip her husband noted that she looked pale and had head was turned to one side and she'll not fully responsive. EMS were called and patient vomited. She was taken to the long emergency room where she was felt to have near syncope and dehydration. She was treated with IV fluids. CT scan of the head, x-rays EKG and lab work were unremarkable. She was discharged on and felt better next morning after taking Sudafed. She traveled to [place] up with felt unwell. She started hearing voices in her head which were talking to each other. She is more able to concentrate and spent time with relatives. She felt better after she rested. Several days later after she had stayed up late in the night the husband noticed that she will have a generalized clonic tonic seizure. She was noted as morning with tossing and turning with teeth clenching. She was taken to the  [facility] hospital where she was diagnosed having seizures and treated with diazepam. She fell asleep. Next-day she was asked to see a neurologist who advised an MRI scan and EEG. She started having speech difficulties and  stopping  midsentence   and unable to complete sentences. The patient's husband [name] L4 medial which have looked at in which patient is making vocalizations but not able to form words. Patient was subsequently hospitalized and underwent a spinal tap which was reportedly normal and an MRI scan of the brain which showed weak diffusion positive lesion in the left medial frontal lobe cingulate gyrus. There were no ADC map images.This lesion was also seen on T2 and flair images and   a postcontrast MRI showed a venous angioma adjacent to this lesion. Lesion was felt to represent cortical dysplasia versus postictal changes from repititive seizures.. The patient was initially started on Trileptal and phenytoin but continued to have auditory hallucinations and some speech and word finding difficulties but eventually she was switched to [place] and she had a miraculous response to this and voices in her head stopped and her speech improved and she's not had any further seizures since then . She however has had some paranoid ideation and is having thoughts of fears of separation from her family and thoughts like ironing clothes we'll start a fire. She is currently taking Keppra 500 twice daily and Trileptal 300 twice daily. She had lab work done on 10/18/13 which showed low sodium of 133 but normal WBC and electrolytes and liver functions otherwise. Update 12/30/2013 : She returns for followup after last visit 3 months ago. She is doing well and has not had any recurrent seizures. She has tapered and  discontinued Trileptal. She is currently on Keppra 500 twice daily. She does complain of some excessive sleepiness and tiredness. She is continues to have mild short-term memory and cognitive difficulties but some of these 3 seated her seizures. Husband is concerned that at times she forgets recent conversations and events. She had EEG done which I have personally reviewed and was normal. A followup MRI scan of the brain done on 12/08/13  personally reviewed shows left frontal small venous angioma without evidence of significant hemorrhage. The left cingulate gyrus swelling and cytotoxic edema have completely resolved compared with previous MRI from June 2015 from [place] Update 08/11/2014 : She returns for follow-up after last visit 7 months ago. She continues to do well without any recurrent seizures. She has been quite compliant with Keppra which she is taking regularly. She is tolerating it well without significant side effects. She does complain of slight fatigue as well as palpitations but this may be related to underlying anxiety and some element of posttraumatic stress. She has not been participating in any regular activities for stress relaxation. ROS:   14 system review of systems is positive for fatigue, unexpected weight change, swollen abdomen, palpitations, acting out dreams, hyperactivity and all other systems negative. PMH:  Past Medical History  Diagnosis Date  . Lower half migraine   . Seizures   . Fatigue   . Hyperactive   . Palpitations     Social History:  History   Social History  . Marital Status: Married    Spouse Name: N/A  . Number of Children: 2  . Years of Education: degree   Occupational History  . school    Social History Main Topics  . Smoking status: Never Smoker   . Smokeless tobacco: Not on file  . Alcohol Use: No  . Drug Use: No  . Sexual Activity: Yes   Other Topics Concern  . Not on file   Social History Narrative   Patient lives with her husband and 2 children   Patient is right handed    Patient drunks tea    Medications:   No current outpatient prescriptions on file prior to visit.   No current facility-administered medications on file prior to visit.    Allergies:   Allergies  Allergen Reactions  . Levaquin [Levofloxacin] Nausea And Vomiting  . Nexium [Esomeprazole] Nausea And Vomiting    Dizziness.     Physical Exam General: well developed, well nourished  young [place] lady, seated, in no evident distress Head: head normocephalic and atraumatic.   Neck: supple with no carotid or supraclavicular bruits Cardiovascular: regular rate and rhythm, no murmurs Musculoskeletal: no deformity Skin:  no rash/petichiae Vascular:  Normal pulses all extremities Filed Vitals:   08/11/14 1011  BP: 95/64  Pulse: 62    Neurologic Exam Mental Status: Awake and fully alert. Oriented to place and time. Recent and remote memory intact. Recall 3/3. Intact calculation. Attention span, concentration and fund of knowledge appropriate. Mood and affect appropriate. No paranoid ideation noted. Mini-Mental Status exam score 27/30 with 2 deficits in orientation and one in the petition. Animal fluency test 15 which is normal. Genetic depression scale 3 which is not suggestive of depression. Cochran 4/4. Cranial Nerves: Fundoscopic exam  Not done . Pupils equal, briskly reactive to light. Extraocular movements full without nystagmus. Visual fields full to confrontation. Hearing intact. Facial sensation intact. Face, tongue, palate moves normally and symmetrically.  Motor: Normal bulk and tone. Normal strength in all  tested extremity muscles. Sensory.: intact to touch and pinprick and vibratory sensation.  Coordination: Rapid alternating movements normal in all extremities. Finger-to-nose and heel-to-shin performed accurately bilaterally. Gait and Station: Arises from chair without difficulty. Stance is normal. Gait demonstrates normal stride length and balance . Able to heel, toe and tandem walk without difficulty.  Reflexes: 1+ and symmetric. Toes downgoing.      ASSESSMENT: 89 year [place] lady with recent episodes of complex partial, simple partial and partial onset seizures with secondary generalization in June 2015 symptomatic from left frontal lesion etiology indeterminate. Adjacent venous angioma with  post ictal MRI changes from repetitive  seizures. Seizures seem now controlled    PLAN: I had a long discussion with the patient and her husband regarding her partial seizures which seem to be quite well controlled on current dose of Keppra 500mg  twice daily. I encouraged her to be compliant with the medications and to avoid seizure provoking stimuli like indication noncompliance, sleep deprivation and stress. I also encouraged her to increase participation in stress relaxation techniques like meditation and yoga and regular exercise. She was advised to return for follow-up in 6 months or call earlier if necessary. She was given her 90 day supply refill for Keppra. Delia HeadyPramod Sethi, MD    Note: This document was prepared with digital dictation and possible smart phrase technology. Any transcriptional errors that result from this process are unintentional.

## 2014-08-11 NOTE — Patient Instructions (Signed)
I had a long discussion with the patient and her husband regarding her partial seizures which seem to be quite well controlled on current dose of Keppra 500mg  twice daily. I encouraged her to be compliant with the medications and to avoid seizure provoking stimuli like indication noncompliance, sleep deprivation and stress. I also encouraged her to increase participation in stress relaxation techniques like meditation and yoga and regular exercise. She was advised to return for follow-up in 6 months or call earlier if necessary. She was given her 90 day supply refill for Keppra.

## 2015-02-14 LAB — LIPID PANEL
Cholesterol: 165 (ref 0–200)
HDL: 50 (ref 35–70)
LDL CALC: 102
TRIGLYCERIDES: 63 (ref 40–160)

## 2015-02-14 LAB — BASIC METABOLIC PANEL: Glucose: 93

## 2015-02-21 ENCOUNTER — Encounter: Payer: Self-pay | Admitting: Neurology

## 2015-02-21 ENCOUNTER — Ambulatory Visit (INDEPENDENT_AMBULATORY_CARE_PROVIDER_SITE_OTHER): Payer: Managed Care, Other (non HMO) | Admitting: Neurology

## 2015-02-21 VITALS — BP 96/64 | HR 71 | Ht 64.0 in | Wt 152.4 lb

## 2015-02-21 DIAGNOSIS — R569 Unspecified convulsions: Secondary | ICD-10-CM

## 2015-02-21 MED ORDER — LEVETIRACETAM 500 MG PO TABS: 500.0000 mg | ORAL_TABLET | Freq: Two times a day (BID) | ORAL | Status: DC

## 2015-02-21 NOTE — Patient Instructions (Signed)
I had a long discussion with the patient and her husband regarding her seizures and counseled her to be compliant with her Keppra. Continue Keppra 500 mg twice daily which is tolerating well. She was given a refill for that medication. She was also encouraged to increase participation in activities for stress relaxation-like regular exercise, swimming, meditation and yoga. She will return for follow-up in 6 months or call earlier if necessary.  Thank you for coming [facility] at [facility]. I hope we have been able to provide you high quality care today. You may receive a patient satisfaction survey over the next few weeks. We would appreciate your feedback and comments so that we may continue to improve ourselves and the health of our patients.

## 2015-02-21 NOTE — Progress Notes (Signed)
[facility] [address] [place] [place] [address] [phone] [id number]       OFFICE FOLLOW UP VISIT NOTE  [name] Date of Birth:  07/07/1970 Medical Record Number:  [id number]   Referring MD:  London Pepper  Reason for Referral:  seizures  HPI: 45 year [place] lady who recently returned from [place] where she had  hospitalization for a repetitive seizures and abnormal MRI scan of the brain. She is accompanied today by husband and has brought with her extensive hospital records, several MRI scans and lab results which I have  personally reviewed. She started feeling sick   actually the first week of June 2015 several days prior to going to [place]. She was felt to have a sinus infection and treated with Augmentin for sinusitis and Zofran for nausea. She felt tired after this and the day prior to her trip her husband noted that she looked pale and had head was turned to one side and she'll not fully responsive. EMS were called and patient vomited. She was taken to the long emergency room where she was felt to have near syncope and dehydration. She was treated with IV fluids. CT scan of the head, x-rays EKG and lab work were unremarkable. She was discharged on and felt better next morning after taking Sudafed. She traveled to [place] up with felt unwell. She started hearing voices in her head which were talking to each other. She is more able to concentrate and spent time with relatives. She felt better after she rested. Several days later after she had stayed up late in the night the husband noticed that she will have a generalized clonic tonic seizure. She was noted as morning with tossing and turning with teeth clenching. She was taken to the  [facility] hospital where she was diagnosed having seizures and treated with diazepam. She fell asleep. Next-day she was asked to see a neurologist who advised an MRI scan and EEG. She started having speech difficulties and  stopping  midsentence   and unable to complete sentences. The patient's husband [name] L4 medial which have looked at in which patient is making vocalizations but not able to form words. Patient was subsequently hospitalized and underwent a spinal tap which was reportedly normal and an MRI scan of the brain which showed weak diffusion positive lesion in the left medial frontal lobe cingulate gyrus. There were no ADC map images.This lesion was also seen on T2 and flair images and   a postcontrast MRI showed a venous angioma adjacent to this lesion. Lesion was felt to represent cortical dysplasia versus postictal changes from repititive seizures.. The patient was initially started on Trileptal and phenytoin but continued to have auditory hallucinations and some speech and word finding difficulties but eventually she was switched to [place] and she had a miraculous response to this and voices in her head stopped and her speech improved and she's not had any further seizures since then . She however has had some paranoid ideation and is having thoughts of fears of separation from her family and thoughts like ironing clothes we'll start a fire. She is currently taking Keppra 500 twice daily and Trileptal 300 twice daily. She had lab work done on 10/18/13 which showed low sodium of 133 but normal WBC and electrolytes and liver functions otherwise. Update 12/30/2013 : She returns for followup after last visit 3 months ago. She is doing well and has not had any recurrent seizures. She has tapered and  discontinued Trileptal. She is currently on Keppra 500 twice daily. She does complain of some excessive sleepiness and tiredness. She is continues to have mild short-term memory and cognitive difficulties but some of these 3 seated her seizures. Husband is concerned that at times she forgets recent conversations and events. She had EEG done which I have personally reviewed and was normal. A followup MRI scan of the brain done on 12/08/13  personally reviewed shows left frontal small venous angioma without evidence of significant hemorrhage. The left cingulate gyrus swelling and cytotoxic edema have completely resolved compared with previous MRI from June 2015 from [place] Update 08/11/2014 : She returns for follow-up after last visit 7 months ago. She continues to do well without any recurrent seizures. She has been quite compliant with Keppra which she is taking regularly. She is tolerating it well without significant side effects. She does complain of slight fatigue as well as palpitations but this may be related to underlying anxiety and some element of posttraumatic stress. She has not been participating in any regular activities for stress relaxation. Update 02/21/2015 : She returns for follow-up after last visit 6 months ago. She is accompanied by husband. She continues to do well without recurrent seizures. She has been compliant with Keppra which she is taking regularly and tolerating it quite well. She continues to have slight fatigue as well as anxiety. She has not been participating in regular activities for stress laxation. She has questions about how long she needs to stay on Keppra and I spent a lot of time counseling patient and her husband the need for long-term anticonvulsants given the fact she had multiple seizures ROS:   14 system review of systems is positive for fatigue,  , anxiety, headache and all other systems negative. PMH:  Past Medical History  Diagnosis Date  . Lower half migraine   . Seizures (HCC)   . Fatigue   . Hyperactive   . Palpitations     Social History:  Social History   Social History  . Marital Status: Married    Spouse Name: N/A  . Number of Children: 2  . Years of Education: degree   Occupational History  . school    Social History Main Topics  . Smoking status: Never Smoker   . Smokeless tobacco: Not on file  . Alcohol Use: No  . Drug Use: No  . Sexual Activity: Yes   Other  Topics Concern  . Not on file   Social History Narrative   Patient lives with her husband and 2 children   Patient is right handed    Patient drunks tea    Medications:   No current outpatient prescriptions on file prior to visit.   No current facility-administered medications on file prior to visit.    Allergies:   Allergies  Allergen Reactions  . Levaquin [Levofloxacin] Nausea And Vomiting  . Nexium [Esomeprazole] Nausea And Vomiting    Dizziness.     Physical Exam General: well developed, well nourished young [place] lady, seated, in no evident distress Head: head normocephalic and atraumatic.   Neck: supple with no carotid or supraclavicular bruits Cardiovascular: regular rate and rhythm, no murmurs Musculoskeletal: no deformity Skin:  no rash/petichiae Vascular:  Normal pulses all extremities Filed Vitals:   02/21/15 1011  BP: 96/64  Pulse: 71    Neurologic Exam Mental Status: Awake and fully alert. Oriented to place and time. Recent and remote memory intact. Recall 3/3. Intact calculation. Attention  span, concentration and fund of knowledge appropriate. Mood and affect appropriate. No paranoid ideation noted. Mini-Mental Status exam not done. Cranial Nerves: Fundoscopic exam  Not done . Pupils equal, briskly reactive to light. Extraocular movements full without nystagmus. Visual fields full to confrontation. Hearing intact. Facial sensation intact. Face, tongue, palate moves normally and symmetrically.  Motor: Normal bulk and tone. Normal strength in all tested extremity muscles. Sensory.: intact to touch and pinprick and vibratory sensation.  Coordination: Rapid alternating movements normal in all extremities. Finger-to-nose and heel-to-shin performed accurately bilaterally. Gait and Station: Arises from chair without difficulty. Stance is normal. Gait demonstrates normal stride length and balance . Able to heel, toe and tandem walk without difficulty.    Reflexes: 1+ and symmetric. Toes downgoing.      ASSESSMENT: [age] year [place] lady with recent episodes of complex partial, simple partial and partial onset seizures with secondary generalization in June 2015 symptomatic from left frontal lesion etiology indeterminate. Adjacent venous angioma with  post ictal MRI changes from repetitive seizures. Seizures seem now controlled    PLAN: I had a long discussion with the patient and her husband regarding her seizures and counseled her to be compliant with her Keppra. Continue Keppra 500 mg twice daily which is tolerating well. She was given a refill for that medication. She was also encouraged to increase participation in activities for stress relaxation-like regular exercise, swimming, meditation and yoga. She will return for follow-up in 6 months or call earlier if necessary. Delia HeadyPramod Lyna Laningham, MD    Note: This document was prepared with digital dictation and possible smart phrase technology. Any transcriptional errors that result from this process are unintentional.

## 2015-08-20 ENCOUNTER — Encounter: Payer: Self-pay | Admitting: Neurology

## 2015-08-20 ENCOUNTER — Ambulatory Visit (INDEPENDENT_AMBULATORY_CARE_PROVIDER_SITE_OTHER): Payer: Managed Care, Other (non HMO) | Admitting: Neurology

## 2015-08-20 VITALS — BP 95/60 | HR 70 | Ht 64.0 in | Wt 149.0 lb

## 2015-08-20 DIAGNOSIS — G40001 Localization-related (focal) (partial) idiopathic epilepsy and epileptic syndromes with seizures of localized onset, not intractable, with status epilepticus: Secondary | ICD-10-CM

## 2015-08-20 MED ORDER — LEVETIRACETAM 500 MG PO TABS: 500.0000 mg | ORAL_TABLET | Freq: Two times a day (BID) | ORAL | Status: DC

## 2015-08-20 NOTE — Progress Notes (Signed)
[facility] [address] [place] [place] [address] [phone] [id number]       OFFICE FOLLOW UP VISIT NOTE  [name] Date of Birth:  07/07/1970 Medical Record Number:  [id number]   Referring MD:  London Pepper  Reason for Referral:  seizures  HPI: 45 year [place] lady who recently returned from [place] where she had  hospitalization for a repetitive seizures and abnormal MRI scan of the brain. She is accompanied today by husband and has brought with her extensive hospital records, several MRI scans and lab results which I have  personally reviewed. She started feeling sick   actually the first week of June 2015 several days prior to going to [place]. She was felt to have a sinus infection and treated with Augmentin for sinusitis and Zofran for nausea. She felt tired after this and the day prior to her trip her husband noted that she looked pale and had head was turned to one side and she'll not fully responsive. EMS were called and patient vomited. She was taken to the long emergency room where she was felt to have near syncope and dehydration. She was treated with IV fluids. CT scan of the head, x-rays EKG and lab work were unremarkable. She was discharged on and felt better next morning after taking Sudafed. She traveled to [place] up with felt unwell. She started hearing voices in her head which were talking to each other. She is more able to concentrate and spent time with relatives. She felt better after she rested. Several days later after she had stayed up late in the night the husband noticed that she will have a generalized clonic tonic seizure. She was noted as morning with tossing and turning with teeth clenching. She was taken to the  [facility] hospital where she was diagnosed having seizures and treated with diazepam. She fell asleep. Next-day she was asked to see a neurologist who advised an MRI scan and EEG. She started having speech difficulties and  stopping  midsentence   and unable to complete sentences. The patient's husband [name] L4 medial which have looked at in which patient is making vocalizations but not able to form words. Patient was subsequently hospitalized and underwent a spinal tap which was reportedly normal and an MRI scan of the brain which showed weak diffusion positive lesion in the left medial frontal lobe cingulate gyrus. There were no ADC map images.This lesion was also seen on T2 and flair images and   a postcontrast MRI showed a venous angioma adjacent to this lesion. Lesion was felt to represent cortical dysplasia versus postictal changes from repititive seizures.. The patient was initially started on Trileptal and phenytoin but continued to have auditory hallucinations and some speech and word finding difficulties but eventually she was switched to [place] and she had a miraculous response to this and voices in her head stopped and her speech improved and she's not had any further seizures since then . She however has had some paranoid ideation and is having thoughts of fears of separation from her family and thoughts like ironing clothes we'll start a fire. She is currently taking Keppra 500 twice daily and Trileptal 300 twice daily. She had lab work done on 10/18/13 which showed low sodium of 133 but normal WBC and electrolytes and liver functions otherwise. Update 12/30/2013 : She returns for followup after last visit 3 months ago. She is doing well and has not had any recurrent seizures. She has tapered and  discontinued Trileptal. She is currently on Keppra 500 twice daily. She does complain of some excessive sleepiness and tiredness. She is continues to have mild short-term memory and cognitive difficulties but some of these 3 seated her seizures. Husband is concerned that at times she forgets recent conversations and events. She had EEG done which I have personally reviewed and was normal. A followup MRI scan of the brain done on 12/08/13  personally reviewed shows left frontal small venous angioma without evidence of significant hemorrhage. The left cingulate gyrus swelling and cytotoxic edema have completely resolved compared with previous MRI from June 2015 from [place] Update 08/11/2014 : She returns for follow-up after last visit 7 months ago. She continues to do well without any recurrent seizures. She has been quite compliant with Keppra which she is taking regularly. She is tolerating it well without significant side effects. She does complain of slight fatigue as well as palpitations but this may be related to underlying anxiety and some element of posttraumatic stress. She has not been participating in any regular activities for stress relaxation. Update 02/21/2015 : She returns for follow-up after last visit 6 months ago. She is accompanied by husband. She continues to do well without recurrent seizures. She has been compliant with Keppra which she is taking regularly and tolerating it quite well. She continues to have slight fatigue as well as anxiety. She has not been participating in regular activities for stress laxation. She has questions about how long she needs to stay on Keppra and I spent a lot of time counseling patient and her husband the need for long-term anticonvulsants given the fact she had multiple seizures Update 08/20/2015 : She returns for follow-up after last visit 6 months ago. She is accompanied by her husband. She is doing well and has not had recurrent seizures now for nearly 2 years. The she is tolerating Keppra 500 mg twice daily quite well without any side effects. Did have a transient episode of pain in the left hand and does have occasional knee and joint pains but otherwise is doing quite well. She wants to lose weight but has not been successful. She has no new complaints today. She and her husband again had questions about the need for and duration of long-term anti-convulsant therapy. ROS:   14 system  review of systems is positive for left hand pain, knee and joint pain and all other systems negative. PMH:  Past Medical History  Diagnosis Date  . Lower half migraine   . Seizures (HCC)   . Fatigue   . Hyperactive   . Palpitations     Social History:  Social History   Social History  . Marital Status: Married    Spouse Name: N/A  . Number of Children: 2  . Years of Education: degree   Occupational History  . school    Social History Main Topics  . Smoking status: Never Smoker   . Smokeless tobacco: Not on file  . Alcohol Use: No  . Drug Use: No  . Sexual Activity: Yes   Other Topics Concern  . Not on file   Social History Narrative   Patient lives with her husband and 2 children   Patient is right handed    Patient drunks tea    Medications:   Current Outpatient Prescriptions on File Prior to Visit  Medication Sig Dispense Refill  . acetaminophen (TYLENOL) 325 MG tablet Take 650 mg by mouth every 6 (six) hours as needed.    Marland Kitchen  ibuprofen (ADVIL,MOTRIN) 100 MG tablet Take 100 mg by mouth every 6 (six) hours as needed for fever.    . Multiple Vitamin (MULTIVITAMIN) capsule Take 1 capsule by mouth daily.     No current facility-administered medications on file prior to visit.    Allergies:   Allergies  Allergen Reactions  . Levaquin [Levofloxacin] Nausea And Vomiting  . Nexium [Esomeprazole] Nausea And Vomiting    Dizziness.     Physical Exam General: well developed, well nourished young [place], seated, in no evident distress Head: head normocephalic and atraumatic.   Neck: supple with no carotid or supraclavicular bruits Cardiovascular: regular rate and rhythm, no murmurs Musculoskeletal: no deformity Skin:  no rash/petichiae Vascular:  Normal pulses all extremities Filed Vitals:   08/20/15 1026  BP: 95/60  Pulse: 70    Neurologic Exam Mental Status: Awake and fully alert. Oriented to place and time. Recent and remote memory  intact. Recall 3/3. Intact calculation. Attention span, concentration and fund of knowledge appropriate. Mood and affect appropriate. No paranoid ideation noted. Mini-Mental Status exam not done. Cranial Nerves: Fundoscopic exam  Not done . Pupils equal, briskly reactive to light. Extraocular movements full without nystagmus. Visual fields full to confrontation. Hearing intact. Facial sensation intact. Face, tongue, palate moves normally and symmetrically.  Motor: Normal bulk and tone. Normal strength in all tested extremity muscles. Sensory.: intact to touch and pinprick and vibratory sensation.  Coordination: Rapid alternating movements normal in all extremities. Finger-to-nose and heel-to-shin performed accurately bilaterally. Gait and Station: Arises from chair without difficulty. Stance is normal. Gait demonstrates normal stride length and balance . Able to heel, toe and tandem walk without difficulty.  Reflexes: 1+ and symmetric. Toes downgoing.      ASSESSMENT: [age] year [place] lady with recent episodes of complex partial, simple partial and partial onset seizures with secondary generalization in June 2015 symptomatic from left frontal lesion etiology indeterminate. Adjacent venous angioma with  post ictal MRI changes from repetitive seizures. Seizures seem now controlled    PLAN: I had a long discussion with the patient and her husband regarding her seizures which appear to be well controlled on the current medication regimen of Keppra 500mg  twice daily. We discussed the risk for seizure recurrence and the need for compliance and adherence with long-term anticonvulsants. She was given a refill of Keppra for a year and advised to return for follow-up in a year or call earlier if necessary. I also advised her on the need for a healthy diet and to increase her activity level and exercise to lose weight. Greater than 50% time during this 25 minute visit was spent on counseling and  coordination of care about seizures and prevention of recurrence. Delia Heady. Flavia Bruss, MD    Note: This document was prepared with digital dictation and possible smart phrase technology. Any transcriptional errors that result from this process are unintentional.

## 2015-08-20 NOTE — Patient Instructions (Signed)
I had a long discussion with the patient and her husband regarding her seizures which appear to be well controlled on the current medication regimen of Keppra 500mg  twice daily. We discussed the risk for seizure recurrence and the need for compliance and adherence with long-term anticonvulsants. She was given a refill of Keppra for a year and advised to return for follow-up in a year or call earlier if necessary.

## 2016-08-19 ENCOUNTER — Ambulatory Visit: Payer: Managed Care, Other (non HMO) | Admitting: Neurology

## 2016-09-02 ENCOUNTER — Ambulatory Visit (INDEPENDENT_AMBULATORY_CARE_PROVIDER_SITE_OTHER): Payer: Commercial Managed Care - PPO | Admitting: Neurology

## 2016-09-02 ENCOUNTER — Encounter: Payer: Self-pay | Admitting: Neurology

## 2016-09-02 ENCOUNTER — Encounter (INDEPENDENT_AMBULATORY_CARE_PROVIDER_SITE_OTHER): Payer: Self-pay

## 2016-09-02 VITALS — BP 90/54 | HR 71 | Ht 64.0 in | Wt 140.8 lb

## 2016-09-02 DIAGNOSIS — G40009 Localization-related (focal) (partial) idiopathic epilepsy and epileptic syndromes with seizures of localized onset, not intractable, without status epilepticus: Secondary | ICD-10-CM

## 2016-09-02 MED ORDER — LEVETIRACETAM 500 MG PO TABS: 500.0000 mg | ORAL_TABLET | Freq: Two times a day (BID) | ORAL | 3 refills | Status: DC

## 2016-09-02 NOTE — Patient Instructions (Signed)
I had a long discussion with the patient   regarding her seizures which appear to be well controlled on the current medication regimen of Keppra 500mg  twice daily. We discussed the risk for seizure recurrence and the need for compliance and adherence with long-term anticonvulsants. She was given a refill of Keppra for a year and advised to return for follow-up in a year or call earlier if necessary

## 2016-09-02 NOTE — Progress Notes (Signed)
[facility] [address] [place] [address] [phone]       OFFICE FOLLOW UP VISIT NOTE  [name] Date of Birth:  07/07/1970 Medical Record Number:  [id number]   Referring MD:  London Pepper  Reason for Referral:  seizures  HPI: 45 year [place] lady who recently returned from [place] where she had  hospitalization for a repetitive seizures and abnormal MRI scan of the brain. She is accompanied today by husband and has brought with her extensive hospital records, several MRI scans and lab results which I have  personally reviewed. She started feeling sick   actually the first week of June 2015 several days prior to going to [place]. She was felt to have a sinus infection and treated with Augmentin for sinusitis and Zofran for nausea. She felt tired after this and the day prior to her trip her husband noted that she looked pale and had head was turned to one side and she'll not fully responsive. EMS were called and patient vomited. She was taken to the long emergency room where she was felt to have near syncope and dehydration. She was treated with IV fluids. CT scan of the head, x-rays EKG and lab work were unremarkable. She was discharged on and felt better next morning after taking Sudafed. She traveled to [place] up with felt unwell. She started hearing voices in her head which were talking to each other. She is more able to concentrate and spent time with relatives. She felt better after she rested. Several days later after she had stayed up late in the night the husband noticed that she will have a generalized clonic tonic seizure. She was noted as morning with tossing and turning with teeth clenching. She was taken to the  [facility] hospital where she was diagnosed having seizures and treated with diazepam. She fell asleep. Next-day she was asked to see a neurologist who advised an MRI scan and EEG. She started having speech difficulties and  stopping  midsentence   and unable to complete sentences. The patient's husband [name] L4 medial which have looked at in which patient is making vocalizations but not able to form words. Patient was subsequently hospitalized and underwent a spinal tap which was reportedly normal and an MRI scan of the brain which showed weak diffusion positive lesion in the left medial frontal lobe cingulate gyrus. There were no ADC map images.This lesion was also seen on T2 and flair images and   a postcontrast MRI showed a venous angioma adjacent to this lesion. Lesion was felt to represent cortical dysplasia versus postictal changes from repititive seizures.. The patient was initially started on Trileptal and phenytoin but continued to have auditory hallucinations and some speech and word finding difficulties but eventually she was switched to [place] and she had a miraculous response to this and voices in her head stopped and her speech improved and she's not had any further seizures since then . She however has had some paranoid ideation and is having thoughts of fears of separation from her family and thoughts like ironing clothes we'll start a fire. She is currently taking Keppra 500 twice daily and Trileptal 300 twice daily. She had lab work done on 10/18/13 which showed low sodium of 133 but normal WBC and electrolytes and liver functions otherwise. Update 12/30/2013 : She returns for followup after last visit 3 months ago. She is doing well and has not had any recurrent seizures. She has tapered and  discontinued Trileptal. She is currently on Keppra 500 twice daily. She does complain of some excessive sleepiness and tiredness. She is continues to have mild short-term memory and cognitive difficulties but some of these 3 seated her seizures. Husband is concerned that at times she forgets recent conversations and events. She had EEG done which I have personally reviewed and was normal. A followup MRI scan of the brain done on 12/08/13  personally reviewed shows left frontal small venous angioma without evidence of significant hemorrhage. The left cingulate gyrus swelling and cytotoxic edema have completely resolved compared with previous MRI from June 2015 from [place] Update 08/11/2014 : She returns for follow-up after last visit 7 months ago. She continues to do well without any recurrent seizures. She has been quite compliant with Keppra which she is taking regularly. She is tolerating it well without significant side effects. She does complain of slight fatigue as well as palpitations but this may be related to underlying anxiety and some element of posttraumatic stress. She has not been participating in any regular activities for stress relaxation. Update 02/21/2015 : She returns for follow-up after last visit 6 months ago. She is accompanied by husband. She continues to do well without recurrent seizures. She has been compliant with Keppra which she is taking regularly and tolerating it quite well. She continues to have slight fatigue as well as anxiety. She has not been participating in regular activities for stress laxation. She has questions about how long she needs to stay on Keppra and I spent a lot of time counseling patient and her husband the need for long-term anticonvulsants given the fact she had multiple seizures Update 08/20/2015 : She returns for follow-up after last visit 6 months ago. She is accompanied by her husband. She is doing well and has not had recurrent seizures now for nearly 2 years. The she is tolerating Keppra 500 mg twice daily quite well without any side effects. Did have a transient episode of pain in the left hand and does have occasional knee and joint pains but otherwise is doing quite well. She wants to lose weight but has not been successful. She has no new complaints today. She and her husband again had questions about the need for and duration of long-term anti-convulsant therapy. Update 09/02/2016 ; She  returns for follow-up after last visit a year ago. She continues to do well without recurrent seizures now for 3 years. She is starting Keppra 500 mg twice daily without any side effects. She has recently started working as a Lawyer. She is very careful and regular with her sleeping and eating habits and avoids driving at night. She has no new neurological complaints today. She has had no new health problems since last 1 year. ROS:   14 system review of systems is positive for no complaints today and all other systems negative. PMH:  Past Medical History:  Diagnosis Date  . Fatigue   . Hyperactive   . Lower half migraine   . Palpitations   . Seizures (HCC)     Social History:  Social History   Social History  . Marital status: Married    Spouse name: N/A  . Number of children: 2  . Years of education: degree   Occupational History  . school Unemployed   Social History Main Topics  . Smoking status: Never Smoker  . Smokeless tobacco: Never Used  . Alcohol use No  . Drug use: No  . Sexual activity: Yes  Other Topics Concern  . Not on file   Social History Narrative   Patient lives with her husband and 2 children   Patient is right handed    Patient drunks tea    Medications:   Current Outpatient Prescriptions on File Prior to Visit  Medication Sig Dispense Refill  . acetaminophen (TYLENOL) 325 MG tablet Take 650 mg by mouth every 6 (six) hours as needed.    Marland Kitchen. ibuprofen (ADVIL,MOTRIN) 100 MG tablet Take 100 mg by mouth every 6 (six) hours as needed for fever.    . Multiple Vitamin (MULTIVITAMIN) capsule Take 1 capsule by mouth daily.     No current facility-administered medications on file prior to visit.     Allergies:   Allergies  Allergen Reactions  . Levaquin [Levofloxacin] Nausea And Vomiting  . Nexium [Esomeprazole] Nausea And Vomiting    Dizziness.     Physical Exam General: well developed, well nourished young [place],  seated, in no evident distress Head: head normocephalic and atraumatic.   Neck: supple with no carotid or supraclavicular bruits Cardiovascular: regular rate and rhythm, no murmurs Musculoskeletal: no deformity Skin:  no rash/petichiae Vascular:  Normal pulses all extremities Vitals:   09/02/16 0938  BP: (!) 90/54  Pulse: 71    Neurologic Exam Mental Status: Awake and fully alert. Oriented to place and time. Recent and remote memory intact. Recall 3/3. Intact calculation. Attention span, concentration and fund of knowledge appropriate. Mood and affect appropriate. No paranoid ideation noted. Mini-Mental Status exam not done. Cranial Nerves: Fundoscopic exam not done . Pupils equal, briskly reactive to light. Extraocular movements full without nystagmus. Visual fields full to confrontation. Hearing intact. Facial sensation intact. Face, tongue, palate moves normally and symmetrically.  Motor: Normal bulk and tone. Normal strength in all tested extremity muscles. Sensory.: intact to touch and pinprick and vibratory sensation.  Coordination: Rapid alternating movements normal in all extremities. Finger-to-nose and heel-to-shin performed accurately bilaterally. Gait and Station: Arises from chair without difficulty. Stance is normal. Gait demonstrates normal stride length and balance . Able to heel, toe and tandem walk without difficulty.  Reflexes: 1+ and symmetric. Toes downgoing.      ASSESSMENT: [age] year [place] lady with recent episodes of complex partial, simple partial and partial onset seizures with secondary generalization in June 2015 symptomatic from left frontal lesion etiology indeterminate. Adjacent venous angioma with  post ictal MRI changes from repetitive seizures. Seizures seem now controlled    PLAN: I had a long discussion with the patient  regarding her seizures which appear to be well controlled on the current medication regimen of Keppra 500 mg twice daily.  We discussed the risk for seizure recurrence and the need for compliance and adherence with long-term anticonvulsants. She was given a refill of Keppra for a year and advised to return for follow-up in a year or call earlier if necessary. I . Greater than 50% time during this 25 minute visit was spent on counseling and coordination of care about seizures and prevention of recurrence. Delia Heady. Enora Trillo, MD    Note: This document was prepared with digital dictation and possible smart phrase technology. Any transcriptional errors that result from this process are unintentional.

## 2016-09-10 ENCOUNTER — Telehealth: Payer: Self-pay | Admitting: Family Medicine

## 2016-09-10 NOTE — Telephone Encounter (Signed)
Spoke with husband to advise that we need to reschedule appt 6/25 due to Dr. Earlene PlaterWallace being out of the office. Patient will cal back to reschedule

## 2016-09-22 ENCOUNTER — Telehealth: Payer: Self-pay | Admitting: Emergency Medicine

## 2016-09-22 ENCOUNTER — Ambulatory Visit: Payer: Managed Care, Other (non HMO) | Admitting: Family Medicine

## 2016-09-22 NOTE — Telephone Encounter (Signed)
Pre-visit call attempted. Spoke with pt's husband and he states that his wife is out and will call tomorrow. I explained to husband that its okay we will complete questions tomorrow at appointment. Understanding verbalized.

## 2016-09-23 ENCOUNTER — Ambulatory Visit (INDEPENDENT_AMBULATORY_CARE_PROVIDER_SITE_OTHER): Payer: Commercial Managed Care - PPO | Admitting: Family Medicine

## 2016-09-23 ENCOUNTER — Encounter: Payer: Self-pay | Admitting: Family Medicine

## 2016-09-23 VITALS — BP 124/70 | HR 75 | Temp 98.6°F | Ht 64.0 in | Wt 139.4 lb

## 2016-09-23 DIAGNOSIS — Z0000 Encounter for general adult medical examination without abnormal findings: Secondary | ICD-10-CM | POA: Diagnosis not present

## 2016-09-23 NOTE — Progress Notes (Signed)
Subjective:    Insurance claims handler, CMA, acting as scribe for Dr. Earlene Plater.  Andrea Berg is a 46 y.o. female and is here for a comprehensive physical exam.  HPI: Patient comes in today to establish care.  No concerns or complaints today.  History of seizure disorder.  Stable on Keppra.  Last seizure at least 3 years ago.  Follows with neurology for this.  There are no preventive care reminders to display for this patient.   PMHx, SurgHx, SocialHx, Medications, and Allergies were reviewed in the Visit Navigator and updated as appropriate.   Past Medical History:  Diagnosis Date  . Fatigue   . Hyperactive   . Lower half migraine   . Palpitations   . Seizures (HCC)    History reviewed. No pertinent surgical history.  Family History  Problem Relation Age of Onset  . Heart attack Father    Social History  Substance Use Topics  . Smoking status: Never Smoker  . Smokeless tobacco: Never Used  . Alcohol use No   Review of Systems:   Review of Systems  Constitutional: Negative.   HENT: Negative.   Eyes: Negative.   Respiratory: Negative.   Cardiovascular: Negative.   Gastrointestinal: Negative.   Genitourinary: Negative.   Musculoskeletal: Negative.   Skin: Negative.   Neurological: Negative.   Endo/Heme/Allergies: Negative.   Psychiatric/Behavioral: Negative.    Objective:   BP 124/70   Pulse 75   Temp 98.6 F (37 C) (Oral)   Ht 5\' 4"  (1.626 m)   Wt 139 lb 6.4 oz (63.2 kg)   LMP 09/01/2016   SpO2 99%   BMI 23.93 kg/m  Body mass index is 23.93 kg/m.  General Appearance:    Alert, cooperative, no distress, appears stated age  Head:    Normocephalic, without obvious abnormality, atraumatic  Eyes:    PERRL, conjunctiva/corneas clear, EOM's intact, fundi benign, both eyes  Ears:    Normal TM's and external ear canals, both ears  Nose:   Nares normal, septum midline, mucosa normal, no drainage     Throat:   Lips, mucosa, and tongue normal; teeth and gums normal    Neck:   Supple, symmetrical, trachea midline, no adenopathy; thyroid: no enlargement/tenderness/nodules  Back:     Symmetric, no curvature, ROM normal, no CVA tenderness  Lungs:     Clear to auscultation bilaterally, respirations unlabored  Chest Wall:    No tenderness or deformity   Heart:    Regular rate and rhythm, S1 and S2 normal, no murmur, rub   or gallop  Abdomen:     Soft, non-tender, bowel sounds active all four quadrants, no masses, no organomegaly  Extremities:   Extremities normal, atraumatic, no cyanosis or edema  Pulses:   2+ and symmetric all extremities  Skin:   Skin color, texture, turgor normal, no rashes or lesions  Lymph nodes:   Cervical, supraclavicular, and axillary nodes normal  Neurologic:   CNII-XII intact, normal strength, sensation and reflexes throughout   Assessment/Plan:   Andrea Berg was seen today for establish care.  Diagnoses and all orders for this visit:  Routine physical examination   Patient Counseling: [x]    Nutrition: Stressed importance of moderation in sodium/caffeine intake, saturated fat and cholesterol, caloric balance, sufficient intake of fresh fruits, vegetables, fiber, calcium, iron, and 1 mg of folate supplement per day (for females capable of pregnancy).  [x]    Stressed the importance of regular exercise.   [x]    Substance Abuse: Discussed cessation/primary  prevention of tobacco, alcohol, or other drug use; driving or other dangerous activities under the influence; availability of treatment for abuse.   [x]    Injury prevention: Discussed safety belts, safety helmets, smoke detector, smoking near bedding or upholstery.   [x]    Sexuality: Discussed sexually transmitted diseases, partner selection, use of condoms, avoidance of unintended pregnancy  and contraceptive alternatives.  [x]    Dental health: Discussed importance of regular tooth brushing, flossing, and dental visits.  [x]    Health maintenance and immunizations reviewed. Please  refer to Health maintenance section.   Andrea RimaErica Jace Dowe, DO [facility]  [place] served as Neurosurgeonscribe during this visit. History, Physical, and Plan performed by medical provider. The above documentation has been reviewed and is accurate and complete. Andrea Berg, D.O.

## 2016-09-23 NOTE — Progress Notes (Deleted)
Andrea Berg is a 46 y.o. female is here to [facility] CARE.   Patient Care Team: Farris Has, MD as PCP - General (Family Medicine)   History of Present Illness:   Joseph Art, CMA, acting as scribe for Dr. Earlene Plater.  CC:  Patient comes in today to establish care.  No concerns or complaints today.  History of seizure disorder.  Stable on Keppra.  Last seizure at least 3 years ago.  Follows with neurology for this.  HPI  Health Maintenance Due  Topic Date Due  . HIV Screening  01/07/1987  . TETANUS/TDAP  01/07/1991  . PAP SMEAR  01/06/1993     There is no immunization history on file for this patient.  PMHx, SurgHx, SocialHx, Medications, and Allergies were reviewed in the Visit Navigator and updated as appropriate.   Past Medical History:  Diagnosis Date  . Fatigue   . Hyperactive   . Lower half migraine   . Palpitations   . Seizures (HCC)    History reviewed. No pertinent surgical history.  Family History  Problem Relation Age of Onset  . Heart attack Father    Social History  Substance Use Topics  . Smoking status: Never Smoker  . Smokeless tobacco: Never Used  . Alcohol use No    Current Medications and Allergies:   Current Outpatient Prescriptions:  .  acetaminophen (TYLENOL) 325 MG tablet, Take 650 mg by mouth every 6 (six) hours as needed., Disp: , Rfl:  .  ibuprofen (ADVIL,MOTRIN) 100 MG tablet, Take 100 mg by mouth every 6 (six) hours as needed for fever., Disp: , Rfl:  .  levETIRAcetam (KEPPRA) 500 MG tablet, Take 1 tablet (500 mg total) by mouth 2 (two) times daily., Disp: 180 tablet, Rfl: 3 .  Multiple Vitamin (MULTIVITAMIN) capsule, Take 1 capsule by mouth daily., Disp: , Rfl:   Allergies  Allergen Reactions  . Levaquin [Levofloxacin] Nausea And Vomiting  . Nexium [Esomeprazole] Nausea And Vomiting    Dizziness.    Review of Systems:   Review of Systems  Constitutional: Negative.   HENT: Negative.   Eyes: Negative.   Respiratory:  Negative.   Cardiovascular: Negative.   Gastrointestinal: Negative.   Genitourinary: Negative.   Musculoskeletal: Negative.   Skin: Negative.   Neurological: Negative.   Endo/Heme/Allergies: Negative.   Psychiatric/Behavioral: Negative.     Vitals:   Vitals:   09/23/16 1345  BP: 124/70  Pulse: 75  Temp: 98.6 F (37 C)  TempSrc: Oral  SpO2: 99%  Weight: 139 lb 6.4 oz (63.2 kg)  Height: 5\' 4"  (1.626 m)     Body mass index is 23.93 kg/m.  Physical Exam:   Physical Exam  Results for orders placed or performed during the hospital encounter of 09/03/13  CBC with Differential  Result Value Ref Range   WBC 8.6 4.0 - 10.5 K/uL   RBC 4.15 3.87 - 5.11 MIL/uL   Hemoglobin 12.0 12.0 - 15.0 g/dL   HCT 16.1 09.6 - 04.5 %   MCV 87.0 78.0 - 100.0 fL   MCH 28.9 26.0 - 34.0 pg   MCHC 33.2 30.0 - 36.0 g/dL   RDW 40.9 81.1 - 91.4 %   Platelets 250 150 - 400 K/uL   Neutrophils Relative % 56 43 - 77 %   Lymphocytes Relative 35 12 - 46 %   Monocytes Relative 7 3 - 12 %   Eosinophils Relative 1 0 - 5 %   Basophils Relative 1 0 -  1 %   Neutro Abs 4.8 1.7 - 7.7 K/uL   Lymphs Abs 3.0 0.7 - 4.0 K/uL   Monocytes Absolute 0.6 0.1 - 1.0 K/uL   Eosinophils Absolute 0.1 0.0 - 0.7 K/uL   Basophils Absolute 0.1 0.0 - 0.1 K/uL   RBC Morphology STOMATOCYTES    Smear Review LARGE PLATELETS PRESENT   Comprehensive metabolic panel  Result Value Ref Range   Sodium 138 137 - 147 mEq/L   Potassium 3.8 3.7 - 5.3 mEq/L   Chloride 99 96 - 112 mEq/L   CO2 27 19 - 32 mEq/L   Glucose, Bld 106 (H) 70 - 99 mg/dL   BUN 7 6 - 23 mg/dL   Creatinine, Ser 1.610.59 0.50 - 1.10 mg/dL   Calcium 9.6 8.4 - 09.610.5 mg/dL   Total Protein 7.8 6.0 - 8.3 g/dL   Albumin 3.8 3.5 - 5.2 g/dL   AST 38 (H) 0 - 37 U/L   ALT 40 (H) 0 - 35 U/L   Alkaline Phosphatase 59 39 - 117 U/L   Total Bilirubin 0.3 0.3 - 1.2 mg/dL   GFR calc non Af Amer >90 >90 mL/min   GFR calc Af Amer >90 >90 mL/min  Troponin I  Result Value Ref Range    Troponin I <0.30 <0.30 ng/mL  POC urine preg, [facility])  Result Value Ref Range   Preg Test, Ur NEGATIVE NEGATIVE    Assessment and Plan:   ***

## 2016-10-04 ENCOUNTER — Other Ambulatory Visit: Payer: Self-pay | Admitting: Neurology

## 2017-08-25 ENCOUNTER — Ambulatory Visit: Payer: Self-pay | Admitting: Family Medicine

## 2017-08-25 NOTE — Telephone Encounter (Signed)
Pt reports left hand, thumb, wrist pain x 3 days. States painful to lift items, 9/10 shooting pain when "turning wrist." Does not recall any recent injury. States H/O carpal tunnel 17 years ago. Intermittent pain from thumb to elbow. Denies any neck pain, no redness or warmth to area, no swelling.  No other symptoms. Appt made with Dr. Earlene Plater for Friday 08/28/17. Instructed to call back if symptoms worsen. Care advise given per protocol. Reason for Disposition . [1] MODERATE pain (e.g., interferes with normal activities) AND [2] present > 3 days  Answer Assessment - Initial Assessment Questions 1. ONSET: "When did the pain start?"     3 days ago 2. LOCATION: "Where is the pain located?"     Left hand, from thumb to elbow, wrist when turning arm 3. PAIN: "How bad is the pain?" (Scale 1-10; or mild, moderate, severe)   - MILD (1-3): doesn't interfere with normal activities   - MODERATE (4-7): interferes with normal activities (e.g., work or school) or awakens from sleep   - SEVERE (8-10): excruciating pain, unable to use hand at all     Intermittent, at wrist when twisting and turning 9/10... shooting 4. WORK OR EXERCISE: "Has there been any recent work or exercise that involved this part of the body?"     No, H/O Carpal tunnel  5. CAUSE: "What do you think is causing the pain?"     Had carpal tunnel 17 years ago 6. AGGRAVATING FACTORS: "What makes the pain worse?" (e.g., using computer)     lifting 7. OTHER SYMPTOMS: "Do you have any other symptoms?" (e.g., neck pain, swelling, rash, numbness, fever)     no 8. PREGNANCY: "Is there any chance you are pregnant?" "When was your last menstrual period?"     no  Protocols used: HAND AND WRIST PAIN-A-AH

## 2017-08-28 ENCOUNTER — Ambulatory Visit: Payer: Commercial Managed Care - PPO | Admitting: Family Medicine

## 2017-08-28 DIAGNOSIS — Z0289 Encounter for other administrative examinations: Secondary | ICD-10-CM

## 2017-08-28 NOTE — Progress Notes (Unsigned)
   Andrea Berg is a 47 y.o. female is here for AN ACUTE VISIT.Clarisa Kindred  History of Present Illness:   HPI: There are no preventive care reminders to display for this patient. Depression screen PHQ 2/9 09/23/2016  Decreased Interest 0  Down, Depressed, Hopeless 0  PHQ - 2 Score 0   PMHx, SurgHx, SocialHx, FamHx, Medications, and Allergies were reviewed in the Visit Navigator and updated as appropriate.   Patient Active Problem List   Diagnosis Date Noted  . Localization-related (focal) (partial) epilepsy and epileptic syndromes with complex partial seizures, with intractable epilepsy 10/19/2013  . Other specified congenital anomalies of brain 10/19/2013   Social History   Tobacco Use  . Smoking status: Never Smoker  . Smokeless tobacco: Never Used  Substance Use Topics  . Alcohol use: No  . Drug use: No   Current Medications and Allergies:   Current Outpatient Medications:  .  acetaminophen (TYLENOL) 325 MG tablet, Take 650 mg by mouth every 6 (six) hours as needed., Disp: , Rfl:  .  ibuprofen (ADVIL,MOTRIN) 100 MG tablet, Take 100 mg by mouth every 6 (six) hours as needed for fever., Disp: , Rfl:  .  levETIRAcetam (KEPPRA) 500 MG tablet, Take 1 tablet (500 mg total) by mouth 2 (two) times daily., Disp: 180 tablet, Rfl: 3 .  Multiple Vitamin (MULTIVITAMIN) capsule, Take 1 capsule by mouth daily., Disp: , Rfl:   Allergies  Allergen Reactions  . Levaquin [Levofloxacin] Nausea And Vomiting  . Nexium [Esomeprazole] Nausea And Vomiting    Dizziness.    Review of Systems   Pertinent items are noted in the HPI. Otherwise, ROS is negative.  Vitals:  There were no vitals filed for this visit.   There is no height or weight on file to calculate BMI.  Physical Exam:   Physical Exam Results for orders placed or performed in visit on 09/25/16  Basic metabolic panel  Result Value Ref Range   Glucose 93   Lipid panel  Result Value Ref Range   Triglycerides 63 40 - 160   Cholesterol  165 0 - 200   HDL 50 35 - 70   LDL Cholesterol 102     Assessment and Plan:   There are no diagnoses linked to this encounter.  . Reviewed expectations re: course of current medical issues. . Discussed self-management of symptoms. . Outlined signs and symptoms indicating need for more acute intervention. . Patient verbalized understanding and all questions were answered. Marland Kitchen. Health Maintenance issues including appropriate healthy diet, exercise, and smoking avoidance were discussed with patient. . See orders for this visit as documented in the electronic medical record. . Patient received an After Visit Summary.  Helane RimaErica Orby Tangen, DO [place] 08/28/2017  Future Appointments  Date Time Provider Department Center  08/28/2017 10:20 AM Helane RimaWallace, Shadaya Marschner, DO [facility]  09/02/2017  3:00 PM Micki RileySethi, Pramod S, MD [facility] None    *** CMA served as scribe during this visit. History, Physical, and Plan performed by medical provider. The above documentation has been reviewed and is accurate and complete. Helane RimaErica Ryane Konieczny, D.O.

## 2017-08-31 ENCOUNTER — Encounter: Payer: Self-pay | Admitting: Family Medicine

## 2017-09-01 ENCOUNTER — Ambulatory Visit: Payer: Commercial Managed Care - PPO | Admitting: Family Medicine

## 2017-09-02 ENCOUNTER — Ambulatory Visit: Payer: Commercial Managed Care - PPO | Admitting: Neurology

## 2017-09-25 ENCOUNTER — Encounter: Payer: Commercial Managed Care - PPO | Admitting: Family Medicine

## 2017-09-30 ENCOUNTER — Other Ambulatory Visit: Payer: Self-pay | Admitting: Neurology

## 2017-10-14 ENCOUNTER — Ambulatory Visit: Payer: Commercial Managed Care - PPO | Admitting: Neurology

## 2017-11-12 ENCOUNTER — Ambulatory Visit (INDEPENDENT_AMBULATORY_CARE_PROVIDER_SITE_OTHER): Payer: Commercial Managed Care - PPO | Admitting: Neurology

## 2017-11-12 ENCOUNTER — Encounter: Payer: Self-pay | Admitting: Neurology

## 2017-11-12 VITALS — BP 109/79 | HR 65 | Ht 65.0 in | Wt 140.4 lb

## 2017-11-12 DIAGNOSIS — G40209 Localization-related (focal) (partial) symptomatic epilepsy and epileptic syndromes with complex partial seizures, not intractable, without status epilepticus: Secondary | ICD-10-CM

## 2017-11-12 MED ORDER — LEVETIRACETAM 500 MG PO TABS: 500.0000 mg | ORAL_TABLET | Freq: Two times a day (BID) | ORAL | 3 refills | Status: DC

## 2017-11-12 NOTE — Patient Instructions (Signed)
I had a long discussion with the patient regarding her seizures which appear to be well controlled on the current medication regimen of Keppra 500 mg twice daily and she has been seizure-free for now greater than 3 years.. We discussed the risk for seizure recurrence and the need for compliance and adherence with long-term anticonvulsants. She was given a refill of Keppra for a year and advised to return for follow-up in a year or call earlier if necessary .  

## 2017-11-12 NOTE — Progress Notes (Signed)
[facility] [address] [place] [address] [phone]       OFFICE FOLLOW UP VISIT NOTE  Ms. Evelina Bucy Date of Birth:  07/07/1970 Medical Record Number:  [id number]   Referring MD:  London Pepper  Reason for Referral:  seizures  HPI: 45 year [place] lady who recently returned from [place] where she had  hospitalization for a repetitive seizures and abnormal MRI scan of the brain. She is accompanied today by husband and has brought with her extensive hospital records, several MRI scans and lab results which I have  personally reviewed. She started feeling sick   actually the first week of June 2015 several days prior to going to [place]. She was felt to have a sinus infection and treated with Augmentin for sinusitis and Zofran for nausea. She felt tired after this and the day prior to her trip her husband noted that she looked pale and had head was turned to one side and she'll not fully responsive. EMS were called and patient vomited. She was taken to the long emergency room where she was felt to have near syncope and dehydration. She was treated with IV fluids. CT scan of the head, x-rays EKG and lab work were unremarkable. She was discharged on and felt better next morning after taking Sudafed. She traveled to [place] up with felt unwell. She started hearing voices in her head which were talking to each other. She is more able to concentrate and spent time with relatives. She felt better after she rested. Several days later after she had stayed up late in the night the husband noticed that she will have a generalized clonic tonic seizure. She was noted as morning with tossing and turning with teeth clenching. She was taken to the  [facility] hospital where she was diagnosed having seizures and treated with diazepam. She fell asleep. Next-day she was asked to see a neurologist who advised an MRI scan and EEG. She started having speech difficulties and  stopping  midsentence   and unable to complete sentences. The patient's husband [name] L4 medial which have looked at in which patient is making vocalizations but not able to form words. Patient was subsequently hospitalized and underwent a spinal tap which was reportedly normal and an MRI scan of the brain which showed weak diffusion positive lesion in the left medial frontal lobe cingulate gyrus. There were no ADC map images.This lesion was also seen on T2 and flair images and   a postcontrast MRI showed a venous angioma adjacent to this lesion. Lesion was felt to represent cortical dysplasia versus postictal changes from repititive seizures.. The patient was initially started on Trileptal and phenytoin but continued to have auditory hallucinations and some speech and word finding difficulties but eventually she was switched to [place] and she had a miraculous response to this and voices in her head stopped and her speech improved and she's not had any further seizures since then . She however has had some paranoid ideation and is having thoughts of fears of separation from her family and thoughts like ironing clothes we'll start a fire. She is currently taking Keppra 500 twice daily and Trileptal 300 twice daily. She had lab work done on 10/18/13 which showed low sodium of 133 but normal WBC and electrolytes and liver functions otherwise. Update 12/30/2013 : She returns for followup after last visit 3 months ago. She is doing well and has not had any recurrent seizures. She has tapered and  discontinued Trileptal. She is currently on Keppra 500 twice daily. She does complain of some excessive sleepiness and tiredness. She is continues to have mild short-term memory and cognitive difficulties but some of these 3 seated her seizures. Husband is concerned that at times she forgets recent conversations and events. She had EEG done which I have personally reviewed and was normal. A followup MRI scan of the brain done on 12/08/13  personally reviewed shows left frontal small venous angioma without evidence of significant hemorrhage. The left cingulate gyrus swelling and cytotoxic edema have completely resolved compared with previous MRI from June 2015 from [place] Update 08/11/2014 : She returns for follow-up after last visit 7 months ago. She continues to do well without any recurrent seizures. She has been quite compliant with Keppra which she is taking regularly. She is tolerating it well without significant side effects. She does complain of slight fatigue as well as palpitations but this may be related to underlying anxiety and some element of posttraumatic stress. She has not been participating in any regular activities for stress relaxation. Update 02/21/2015 : She returns for follow-up after last visit 6 months ago. She is accompanied by husband. She continues to do well without recurrent seizures. She has been compliant with Keppra which she is taking regularly and tolerating it quite well. She continues to have slight fatigue as well as anxiety. She has not been participating in regular activities for stress laxation. She has questions about how long she needs to stay on Keppra and I spent a lot of time counseling patient and her husband the need for long-term anticonvulsants given the fact she had multiple seizures Update 08/20/2015 : She returns for follow-up after last visit 6 months ago. She is accompanied by her husband. She is doing well and has not had recurrent seizures now for nearly 2 years. The she is tolerating Keppra 500 mg twice daily quite well without any side effects. Did have a transient episode of pain in the left hand and does have occasional knee and joint pains but otherwise is doing quite well. She wants to lose weight but has not been successful. She has no new complaints today. She and her husband again had questions about the need for and duration of long-term anti-convulsant therapy. Update 09/02/2016 ; She  returns for follow-up after last visit a year ago. She continues to do well without recurrent seizures now for 3 years. She is starting Keppra 500 mg twice daily without any side effects. 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 She is very careful and regular with her sleeping and eating habits and avoids driving at night. She has no new neurological complaints today. She has had no new health problems since last 1 year. Update 11/12/2017 : She returns for follow-up after last visit a year ago. She continues to do well and hasn't not had recurrent seizures for greater than 3 years. She is tolerating Keppra 500 mg twice daily without any side effects. She has no neurological complaints no new health problems since last year. She plans to see her primary care physician for new complaints of knee pain which are not quite bothersome. ROS:   14 system review of systems is positive for no complaints today and all other systems negative. PMH:  Past Medical History:  Diagnosis Date  . Fatigue   . Hyperactive   . Lower half migraine   . Palpitations   . Seizures (HCC)     Social History:  Social History   Socioeconomic History  . Marital status: Married    Spouse name: Not on file  . Number of children: 2  . Years of education: degree  . Highest education level: Not on file  Occupational History  . Occupation: school    Employer: UNEMPLOYED  Social Needs  . Financial resource strain: Not on file  . Food insecurity:    Worry: Not on file    Inability: Not on file  . Transportation needs:    Medical: Not on file    Non-medical: Not on file  Tobacco Use  . Smoking status: Never Smoker  . Smokeless tobacco: Never Used  Substance and Sexual Activity  . Alcohol use: No  . Drug use: No  . Sexual activity: Yes  Lifestyle  . Physical activity:    Days per week: Not on file    Minutes per session: Not on file  . Stress: Not on file  Relationships  . Social connections:     Talks on phone: Not on file    Gets together: Not on file    Attends religious service: Not on file    Active member of club or organization: Not on file    Attends meetings of clubs or organizations: Not on file    Relationship status: Not on file  . Intimate partner violence:    Fear of current or ex partner: Not on file    Emotionally abused: Not on file    Physically abused: Not on file    Forced sexual activity: Not on file  Other Topics Concern  . Not on file  Social History Narrative   Patient lives with her husband and 2 children   Patient is right handed    Patient drunks tea    Medications:   Current Outpatient Medications on File Prior to Visit  Medication Sig Dispense Refill  . acetaminophen (TYLENOL) 325 MG tablet Take 650 mg by mouth every 6 (six) hours as needed.    Marland Kitchen ibuprofen (ADVIL,MOTRIN) 100 MG tablet Take 100 mg by mouth every 6 (six) hours as needed for fever.    . Multiple Vitamin (MULTIVITAMIN) capsule Take 1 capsule by mouth daily.     No current facility-administered medications on file prior to visit.     Allergies:   Allergies  Allergen Reactions  . Levaquin [Levofloxacin] Nausea And Vomiting  . Nexium [Esomeprazole] Nausea And Vomiting    Dizziness.     Physical Exam General: well developed, well nourished young  [place] lady, seated, in no evident distress Head: head normocephalic and atraumatic.   Neck: supple with no carotid or supraclavicular bruits Cardiovascular: regular rate and rhythm, no murmurs Musculoskeletal: no deformity Skin:  no rash/petichiae Vascular:  Normal pulses all extremities Vitals:   11/12/17 1139  BP: 109/79  Pulse: 65    Neurologic Exam Mental Status: Awake and fully alert. Oriented to place and time. Recent and remote memory intact. . Attention span, concentration and fund of knowledge appropriate. Mood and affect appropriate. No paranoid ideation noted. Mini-Mental Status exam not done. Cranial  Nerves: Fundoscopic exam not done . Pupils equal, briskly reactive to light. Extraocular movements full without nystagmus. Visual fields full to confrontation. Hearing intact. Facial sensation intact. Face, tongue, palate moves normally and symmetrically.  Motor: Normal bulk and tone. Normal strength in all tested extremity muscles. Sensory.: intact to touch and pinprick and vibratory sensation.  Coordination: Rapid alternating movements normal in all extremities. Finger-to-nose and  heel-to-shin performed accurately bilaterally. Gait and Station: Arises from chair without difficulty. Stance is normal. Gait demonstrates normal stride length and balance . Able to heel, toe and tandem walk without difficulty.  Reflexes: 1+ and symmetric. Toes downgoing.      ASSESSMENT: [age] year [place] lady with recent episodes of complex partial, simple partial and partial onset seizures with secondary generalization in June 2015 symptomatic from left frontal lesion etiology indeterminate. Adjacent venous angioma with  post ictal MRI changes from repetitive seizures. Seizures seem now controlled    PLAN: I had a long discussion with the patient  regarding her seizures which appear to be well controlled on the current medication regimen of Keppra 500 mg twice daily and she has been seizure-free for now greater than 3 years.. We discussed the risk for seizure recurrence and the need for compliance and adherence with long-term anticonvulsants. She was given a refill of Keppra for a year and advised to return for follow-up in a year or call earlier if necessary . Greater than 50% time during this 25 minute visit was spent on counseling and coordination of care about seizures and prevention of recurrence. Delia Heady. Pramod Sethi, MD    Note: This document was prepared with digital dictation and possible smart phrase technology. Any transcriptional errors that result from this process are unintentional.

## 2017-11-25 ENCOUNTER — Encounter: Payer: Commercial Managed Care - PPO | Admitting: Family Medicine

## 2017-12-27 NOTE — Progress Notes (Signed)
Subjective:    Andrea Berg is a 47 y.o. female and is here for a comprehensive physical exam.  Health Maintenance Due  Topic Date Due  . HIV Screening  01/07/1987  . PAP SMEAR  01/06/1993  . INFLUENZA VACCINE  10/29/2017    Current Outpatient Medications:  .  acetaminophen (TYLENOL) 325 MG tablet, Take 650 mg by mouth every 6 (six) hours as needed., Disp: , Rfl:  .  ibuprofen (ADVIL,MOTRIN) 100 MG tablet, Take 100 mg by mouth every 6 (six) hours as needed for fever., Disp: , Rfl:  .  levETIRAcetam (KEPPRA) 500 MG tablet, Take 1 tablet (500 mg total) by mouth 2 (two) times daily., Disp: 180 tablet, Rfl: 3 .  Multiple Vitamin (MULTIVITAMIN) capsule, Take 1 capsule by mouth daily., Disp: , Rfl:   PMHx, SurgHx, SocialHx, Medications, and Allergies were reviewed in the Visit Navigator and updated as appropriate.   Past Medical History:  Diagnosis Date  . Fatigue   . Hyperactive   . Lower half migraine   . Palpitations   . Seizures (HCC)    History reviewed. No pertinent surgical history.   Family History  Problem Relation Age of Onset  . Heart attack Father    Social History   Tobacco Use  . Smoking status: Never Smoker  . Smokeless tobacco: Never Used  Substance Use Topics  . Alcohol use: No  . Drug use: No   Review of Systems:   Pertinent items are noted in the HPI. Otherwise, ROS is negative.  Objective:   BP 109/79   Pulse 98   Temp 99 F (37.2 C) (Oral)   Ht 5\' 5"  (1.651 m)   Wt 140 lb 12.8 oz (63.9 kg)   LMP 12/07/2017   SpO2 98%   BMI 23.43 kg/m   General appearance: alert, cooperative and appears stated age. Head: normocephalic, without obvious abnormality, atraumatic. Neck: no adenopathy, supple, symmetrical, trachea midline; thyroid not enlarged, symmetric, no tenderness/mass/nodules. Lungs: clear to auscultation bilaterally. Heart: regular rate and rhythm Abdomen: soft, non-tender; no masses,  no organomegaly. Extremities: extremities  normal, atraumatic, no cyanosis or edema. Skin: skin color, texture, turgor normal, no rashes or lesions. Lymph: cervical, supraclavicular, and axillary nodes normal; no abnormal inguinal nodes palpated. Neurologic: grossly normal.  Pelvic:  External genitalia: no lesions. Urethra: normal appearing urethra with no masses, tenderness or lesions. Bartholin's and Skene's: normal. Vagina: normal appearing vagina with normal color and discharge, no lesions. Cervix: normal appearance. Pap and high risk HPV testing done: Yes.   Uterus: uterus is normal size, shape, consistency and nontender. Adnexa: normal adnexa in size, nontender and no masses.                                      Assessment/Plan:   Beula was seen today for annual exam.  Diagnoses and all orders for this visit:  Routine physical examination  Vitamin D deficiency -     VITAMIN D 25 Hydroxy (Vit-D Deficiency, Fractures)  Screening for lipid disorders -     Lipid panel  Acute pain of both knees Comments: Exercises provided. To Berline Chough if worsens.   Fatigue, unspecified type -     CBC with Differential/Platelet -     Comprehensive metabolic panel -     TSH -     Vitamin B12  Pap smear for cervical cancer screening -  Cytology - PAP  Screening for HIV (human immunodeficiency virus) -     HIV Antibody (routine testing w rflx)  Need for immunization against influenza -     Flu Vaccine QUAD 36+ mos IM   Patient Counseling: [x]    Nutrition: Stressed importance of moderation in sodium/caffeine intake, saturated fat and cholesterol, caloric balance, sufficient intake of fresh fruits, vegetables, fiber, calcium, iron, and 1 mg of folate supplement per day (for females capable of pregnancy).  [x]    Stressed the importance of regular exercise.   [x]    Substance Abuse: Discussed cessation/primary prevention of tobacco, alcohol, or other drug use; driving or other dangerous activities under the influence; availability of  treatment for abuse.   [x]    Injury prevention: Discussed safety belts, safety helmets, smoke detector, smoking near bedding or upholstery.   [x]    Sexuality: Discussed sexually transmitted diseases, partner selection, use of condoms, avoidance of unintended pregnancy  and contraceptive alternatives.  [x]    Dental health: Discussed importance of regular tooth brushing, flossing, and dental visits.  [x]    Health maintenance and immunizations reviewed. Please refer to Health maintenance section.   Helane Rima, DO [facility]

## 2017-12-28 ENCOUNTER — Encounter: Payer: Self-pay | Admitting: Family Medicine

## 2017-12-28 ENCOUNTER — Ambulatory Visit (INDEPENDENT_AMBULATORY_CARE_PROVIDER_SITE_OTHER): Payer: Commercial Managed Care - PPO | Admitting: Family Medicine

## 2017-12-28 ENCOUNTER — Other Ambulatory Visit (HOSPITAL_COMMUNITY)
Admission: RE | Admit: 2017-12-28 | Discharge: 2017-12-28 | Disposition: A | Discharge status: Home / Self Care | Payer: Commercial Managed Care - PPO | Source: Ambulatory Visit | Attending: Family Medicine | Admitting: Family Medicine

## 2017-12-28 VITALS — BP 109/79 | HR 98 | Temp 99.0°F | Ht 65.0 in | Wt 140.8 lb

## 2017-12-28 DIAGNOSIS — R5383 Other fatigue: Secondary | ICD-10-CM

## 2017-12-28 DIAGNOSIS — M25561 Pain in right knee: Secondary | ICD-10-CM

## 2017-12-28 DIAGNOSIS — Z124 Encounter for screening for malignant neoplasm of cervix: Secondary | ICD-10-CM | POA: Insufficient documentation

## 2017-12-28 DIAGNOSIS — Z13220 Encounter for screening for lipoid disorders: Secondary | ICD-10-CM

## 2017-12-28 DIAGNOSIS — M25562 Pain in left knee: Secondary | ICD-10-CM

## 2017-12-28 DIAGNOSIS — Z23 Encounter for immunization: Secondary | ICD-10-CM

## 2017-12-28 DIAGNOSIS — E559 Vitamin D deficiency, unspecified: Secondary | ICD-10-CM | POA: Diagnosis not present

## 2017-12-28 DIAGNOSIS — Z0000 Encounter for general adult medical examination without abnormal findings: Secondary | ICD-10-CM | POA: Diagnosis not present

## 2017-12-28 DIAGNOSIS — Z114 Encounter for screening for human immunodeficiency virus [HIV]: Secondary | ICD-10-CM

## 2017-12-28 LAB — COMPREHENSIVE METABOLIC PANEL
ALT: 14 U/L (ref 0–35)
AST: 17 U/L (ref 0–37)
Albumin: 4.3 g/dL (ref 3.5–5.2)
Alkaline Phosphatase: 59 U/L (ref 39–117)
BUN: 10 mg/dL (ref 6–23)
CO2: 27 mEq/L (ref 19–32)
Calcium: 9.6 mg/dL (ref 8.4–10.5)
Chloride: 103 mEq/L (ref 96–112)
Creatinine, Ser: 0.66 mg/dL (ref 0.40–1.20)
GFR: 102.49 mL/min (ref >60.00)
Glucose, Bld: 89 mg/dL (ref 70–99)
Potassium: 4.4 mEq/L (ref 3.5–5.1)
Sodium: 138 mEq/L (ref 135–145)
Total Bilirubin: 0.7 mg/dL (ref 0.2–1.2)
Total Protein: 7.6 g/dL (ref 6.0–8.3)

## 2017-12-28 LAB — CBC WITH DIFFERENTIAL/PLATELET
Basophils Absolute: 0 10*3/uL (ref 0.0–0.1)
Basophils Relative: 0.5 % (ref 0.0–3.0)
Eosinophils Absolute: 0 10*3/uL (ref 0.0–0.7)
Eosinophils Relative: 0.3 % (ref 0.0–5.0)
HCT: 37.8 % (ref 36.0–46.0)
Hemoglobin: 12.9 g/dL (ref 12.0–15.0)
Lymphocytes Relative: 34.8 % (ref 12.0–46.0)
Lymphs Abs: 2.3 10*3/uL (ref 0.7–4.0)
MCHC: 34.1 g/dL (ref 30.0–36.0)
MCV: 89 fl (ref 78.0–100.0)
Monocytes Absolute: 0.5 10*3/uL (ref 0.1–1.0)
Monocytes Relative: 7.1 % (ref 3.0–12.0)
Neutro Abs: 3.8 10*3/uL (ref 1.4–7.7)
Neutrophils Relative %: 57.3 % (ref 43.0–77.0)
Platelets: 257 10*3/uL (ref 150.0–400.0)
RBC: 4.24 Mil/uL (ref 3.87–5.11)
RDW: 13.2 % (ref 11.5–15.5)
WBC: 6.7 10*3/uL (ref 4.0–10.5)

## 2017-12-28 LAB — VITAMIN D 25 HYDROXY (VIT D DEFICIENCY, FRACTURES): VITD: 22.22 ng/mL — ABNORMAL LOW (ref 30.00–100.00)

## 2017-12-28 LAB — LIPID PANEL
Cholesterol: 174 mg/dL (ref 0–200)
HDL: 51.8 mg/dL (ref >39.00)
LDL Cholesterol: 109 mg/dL — ABNORMAL HIGH (ref 0–99)
NonHDL: 122.42
Total CHOL/HDL Ratio: 3
Triglycerides: 68 mg/dL (ref 0.0–149.0)
VLDL: 13.6 mg/dL (ref 0.0–40.0)

## 2017-12-28 LAB — VITAMIN B12: Vitamin B-12: 382 pg/mL (ref 211–911)

## 2017-12-28 LAB — TSH: TSH: 3.17 u[IU]/mL (ref 0.35–4.50)

## 2017-12-29 LAB — HIV ANTIBODY (ROUTINE TESTING W REFLEX): HIV 1&2 Ab, 4th Generation: NONREACTIVE

## 2017-12-30 LAB — CYTOLOGY - PAP
Diagnosis: NEGATIVE
HPV: NOT DETECTED

## 2018-01-12 ENCOUNTER — Telehealth: Payer: Self-pay | Admitting: Family Medicine

## 2018-01-12 NOTE — Telephone Encounter (Signed)
Called patient back and went over lab results.  

## 2018-01-12 NOTE — Telephone Encounter (Signed)
Copied from CRM [id number]. Topic: General - Other >> Jan 12, 2018 10:18 AM Angela Nevin wrote: Reason for CRM: Pt called stating that she was returning a call to Dr. Earlene Plater. Pt is requesting call back at [phone].

## 2018-09-06 ENCOUNTER — Telehealth: Payer: Self-pay | Admitting: Family Medicine

## 2018-09-06 NOTE — Telephone Encounter (Signed)
Copied from [place] [id number]. Topic: Appointment Scheduling - Scheduling Inquiry for Clinic >> Sep 06, 2018  4:45 PM Erick Blinks wrote: Andrea Berg taste in her mouth, no taste when she is eating Best Contact: [phone] VM available

## 2018-09-07 NOTE — Telephone Encounter (Signed)
Called patient doxy app made with Juleen China

## 2018-09-08 ENCOUNTER — Encounter: Payer: Self-pay | Admitting: Family Medicine

## 2018-09-08 ENCOUNTER — Other Ambulatory Visit: Payer: Self-pay

## 2018-09-08 ENCOUNTER — Ambulatory Visit (INDEPENDENT_AMBULATORY_CARE_PROVIDER_SITE_OTHER): Payer: Commercial Managed Care - PPO | Admitting: Family Medicine

## 2018-09-08 VITALS — Temp 98.0°F | Ht 65.0 in | Wt 137.0 lb

## 2018-09-08 DIAGNOSIS — R208 Other disturbances of skin sensation: Secondary | ICD-10-CM | POA: Diagnosis not present

## 2018-09-08 DIAGNOSIS — R739 Hyperglycemia, unspecified: Secondary | ICD-10-CM | POA: Diagnosis not present

## 2018-09-08 DIAGNOSIS — R1011 Right upper quadrant pain: Secondary | ICD-10-CM | POA: Diagnosis not present

## 2018-09-08 MED ORDER — NYSTATIN 100000 UNIT/ML MT SUSP: 5.0000 mL | Freq: Four times a day (QID) | OROMUCOSAL | 0 refills | Status: DC

## 2018-09-08 NOTE — Progress Notes (Signed)
Virtual Visit via Video   Due to the COVID-19 pandemic, this visit was completed with telemedicine (audio/video) technology to reduce patient and provider exposure as well as to preserve personal protective equipment.   I connected with Clarisa KindredSubarna Thivierge by a video enabled telemedicine application and verified that I am speaking with the correct person using two identifiers. Location patient: Home Location provider: [facility], Office Persons participating in the virtual visit: Deidre AlaSubarna Alvidrez, Aliannah Holstrom, DO   I discussed the limitations of evaluation and management by telemedicine and the availability of in person appointments. The patient expressed understanding and agreed to proceed.  Care Team   Patient Care Team: Helane RimaWallace, Margrit Minner, DO as PCP - General (Family Medicine)  Subjective:   HPI: Burning tongue and mouth. Negative COVID test (throught [facility]. No change in smell. Increased temp to 99 F, aches. RUQ pain intermittently, Hx of the same and told that she may need gallbladder removed in the future.   Review of Systems  Constitutional: Positive for malaise/fatigue. Negative for chills and fever.       Pt felt feverish several days ago.  No fever today  HENT: Negative for sore throat.        Loss of taste  Eyes:       Intermittent burning in both eyes  Respiratory: Negative for shortness of breath.   Cardiovascular: Negative for chest pain.  Gastrointestinal: Negative for abdominal pain, constipation, diarrhea and nausea.       C/o burning sensation in stomach  Musculoskeletal: Positive for back pain and myalgias. Negative for neck pain.       C/o achiness in legs and knees  Neurological: Negative for dizziness and headaches.    Patient Active Problem List   Diagnosis Date Noted  . Localization-related (focal) (partial) epilepsy and epileptic syndromes with complex partial seizures, with intractable epilepsy 10/19/2013  . Other specified congenital anomalies of brain  10/19/2013    Social History   Tobacco Use  . Smoking status: Never Smoker  . Smokeless tobacco: Never Used  Substance Use Topics  . Alcohol use: No    Current Outpatient Medications:  .  acetaminophen (TYLENOL) 325 MG tablet, Take 650 mg by mouth every 6 (six) hours as needed., Disp: , Rfl:  .  ibuprofen (ADVIL,MOTRIN) 100 MG tablet, Take 100 mg by mouth every 6 (six) hours as needed for fever., Disp: , Rfl:  .  levETIRAcetam (KEPPRA) 500 MG tablet, Take 1 tablet (500 mg total) by mouth 2 (two) times daily., Disp: 180 tablet, Rfl: 3 .  Multiple Vitamin (MULTIVITAMIN) capsule, Take 1 capsule by mouth daily., Disp: , Rfl:  .  nystatin (MYCOSTATIN) 100000 UNIT/ML suspension, Take 5 mLs (500,000 Units total) by mouth 4 (four) times daily., Disp: 120 mL, Rfl: 0  Allergies  Allergen Reactions  . Levaquin [Levofloxacin] Nausea And Vomiting  . Nexium [Esomeprazole] Nausea And Vomiting    Dizziness.     Objective:   VITALS: Per patient if applicable, see vitals. GENERAL: Alert, appears well and in no acute distress. HEENT: Atraumatic, conjunctiva clear, no obvious abnormalities on inspection of external nose and ears. NECK: Normal movements of the head and neck. CARDIOPULMONARY: No increased WOB. Speaking in clear sentences. I:E ratio WNL.  MS: Moves all visible extremities without noticeable abnormality. PSYCH: Pleasant and cooperative, well-groomed. Speech normal rate and rhythm. Affect is appropriate. Insight and judgement are appropriate. Attention is focused, linear, and appropriate.  NEURO: CN grossly intact. Oriented as arrived to  appointment on time with no prompting. Moves both UE equally.  SKIN: No obvious lesions, wounds, erythema, or cyanosis noted on face or hands.  Depression screen [facility] 2/9 12/28/2017 09/23/2016  Decreased Interest 0 0  Down, Depressed, Hopeless 0 0  PHQ - 2 Score 0 0    Assessment and Plan:   Alaysha was seen today for loss of taste.  Diagnoses and  all orders for this visit:  Burning sensation of mouth -     nystatin (MYCOSTATIN) 100000 UNIT/ML suspension; Take 5 mLs (500,000 Units total) by mouth 4 (four) times daily. -     CBC with Differential/Platelet; Future -     Comprehensive metabolic panel; Future -     Iron, TIBC and Ferritin Panel; Future -     TSH; Future -     Vitamin B12; Future  RUQ pain -     US ABDOMEN LIMITED RUQ; Future  Hyperglycemia -     Hemoglobin A1c; Future    . COVID-19 Education: The signs and symptoms of COVID-19 were discussed with the patient and how to seek care for testing if needed. The importance of social distancing was discussed today. . Reviewed expectations re: course of current medical issues. . Discussed self-management of symptoms. . Outlined signs and symptoms indicating need for more acute intervention. . Patient verbalized understanding and all questions were answered. Marland Kitchen Health Maintenance issues including appropriate healthy diet, exercise, and smoking avoidance were discussed with patient. . See orders for this visit as documented in the electronic medical record.  Briscoe Deutscher, DO  Records requested if needed. Time spent: 25 minutes, of which >50% was spent in obtaining information about her symptoms, reviewing her previous labs, evaluations, and treatments, counseling her about her condition (please see the discussed topics above), and developing a plan to further investigate it; she had a number of questions which I addressed.

## 2018-09-09 ENCOUNTER — Other Ambulatory Visit (INDEPENDENT_AMBULATORY_CARE_PROVIDER_SITE_OTHER): Payer: Commercial Managed Care - PPO

## 2018-09-09 DIAGNOSIS — R739 Hyperglycemia, unspecified: Secondary | ICD-10-CM | POA: Diagnosis not present

## 2018-09-09 DIAGNOSIS — R208 Other disturbances of skin sensation: Secondary | ICD-10-CM

## 2018-09-09 LAB — CBC WITH DIFFERENTIAL/PLATELET
Basophils Absolute: 0 10*3/uL (ref 0.0–0.1)
Basophils Relative: 0.3 % (ref 0.0–3.0)
Eosinophils Absolute: 0 10*3/uL (ref 0.0–0.7)
Eosinophils Relative: 0.7 % (ref 0.0–5.0)
HCT: 33.8 % — ABNORMAL LOW (ref 36.0–46.0)
Hemoglobin: 11.6 g/dL — ABNORMAL LOW (ref 12.0–15.0)
Lymphocytes Relative: 36.7 % (ref 12.0–46.0)
Lymphs Abs: 2.6 10*3/uL (ref 0.7–4.0)
MCHC: 34.2 g/dL (ref 30.0–36.0)
MCV: 89.6 fl (ref 78.0–100.0)
Monocytes Absolute: 0.4 10*3/uL (ref 0.1–1.0)
Monocytes Relative: 6.1 % (ref 3.0–12.0)
Neutro Abs: 4 10*3/uL (ref 1.4–7.7)
Neutrophils Relative %: 56.2 % (ref 43.0–77.0)
Platelets: 215 10*3/uL (ref 150.0–400.0)
RBC: 3.77 Mil/uL — ABNORMAL LOW (ref 3.87–5.11)
RDW: 13.2 % (ref 11.5–15.5)
WBC: 7.1 10*3/uL (ref 4.0–10.5)

## 2018-09-09 LAB — COMPREHENSIVE METABOLIC PANEL
ALT: 10 U/L (ref 0–35)
AST: 12 U/L (ref 0–37)
Albumin: 3.8 g/dL (ref 3.5–5.2)
Alkaline Phosphatase: 57 U/L (ref 39–117)
BUN: 9 mg/dL (ref 6–23)
CO2: 26 mEq/L (ref 19–32)
Calcium: 9 mg/dL (ref 8.4–10.5)
Chloride: 104 mEq/L (ref 96–112)
Creatinine, Ser: 0.61 mg/dL (ref 0.40–1.20)
GFR: 105.28 mL/min (ref >60.00)
Glucose, Bld: 97 mg/dL (ref 70–99)
Potassium: 4 mEq/L (ref 3.5–5.1)
Sodium: 138 mEq/L (ref 135–145)
Total Bilirubin: 0.4 mg/dL (ref 0.2–1.2)
Total Protein: 6.6 g/dL (ref 6.0–8.3)

## 2018-09-09 LAB — HEMOGLOBIN A1C: Hgb A1c MFr Bld: 5.7 % (ref 4.6–6.5)

## 2018-09-09 LAB — VITAMIN B12: Vitamin B-12: 376 pg/mL (ref 211–911)

## 2018-09-09 LAB — TSH: TSH: 1.38 u[IU]/mL (ref 0.35–4.50)

## 2018-09-10 ENCOUNTER — Telehealth: Payer: Self-pay

## 2018-09-10 LAB — IRON,TIBC AND FERRITIN PANEL
%SAT: 15 % (calc) — ABNORMAL LOW (ref 16–45)
Ferritin: 32 ng/mL (ref 16–232)
Iron: 43 ug/dL (ref 40–190)
TIBC: 294 mcg/dL (calc) (ref 250–450)

## 2018-09-10 NOTE — Telephone Encounter (Signed)
I spoke with patient and informed her that as soon as labs are resulted, the office  will give her a call.

## 2018-09-10 NOTE — Telephone Encounter (Signed)
Copied from [place] [id number]. Topic: General - Inquiry >> Sep 10, 2018 12:58 PM Rutherford Nail, NT wrote: Reason for CRM: Patient calling to check status of results from yesterday. Please advise.

## 2018-09-13 ENCOUNTER — Telehealth: Payer: Self-pay | Admitting: Family Medicine

## 2018-09-13 NOTE — Telephone Encounter (Signed)
Pt given lab results per notes of Dr Juleen China  on 09/12/2018. Pt verbalized understanding. Pt states that her mouth is getting better.  She still has burning. She notices a 40% impovement. Pt told to call back if she does not has resolution of the burning in just a few days.

## 2018-09-13 NOTE — Telephone Encounter (Signed)
See note

## 2018-09-14 NOTE — Telephone Encounter (Signed)
FYI

## 2018-09-16 NOTE — Telephone Encounter (Signed)
Please advise on mouth questions

## 2018-09-16 NOTE — Telephone Encounter (Signed)
Pt called and stated that she would like a call back from the nurse. Pt would like to know the cause of the fungal infection in mouth so maybe she can avoid it next time. Please advise   Pt Also asked about ultrasound. Pt has not received call for scheduling

## 2018-09-17 NOTE — Telephone Encounter (Signed)
Please schedule RUQ [place] for pain and concern for gallbladder pathology. I cannot say why she had the throat issue yet. Will need to monitor for now.

## 2018-09-17 NOTE — Telephone Encounter (Signed)
Can you f/u on u/s?

## 2018-09-30 ENCOUNTER — Telehealth: Payer: Self-pay

## 2018-09-30 NOTE — Telephone Encounter (Signed)
LM for patient to call office back

## 2018-09-30 NOTE — Telephone Encounter (Signed)
Copied from [place] [phone]. Topic: Referral - Status >> Sep 30, 2018  2:11 PM Scherrie Gerlach wrote: Reason for CRM: pt following up on referral for [place]. The preferred location for [place] states "internal".  Not sure that is a good or correct "location." pt would like a call back asap, as she has appt 7/15 with the dr to go over these results, and she has not done the [place] yet

## 2018-10-04 NOTE — Telephone Encounter (Signed)
See note

## 2018-10-04 NOTE — Telephone Encounter (Signed)
Do you see where they have called for app?

## 2018-10-04 NOTE — Telephone Encounter (Signed)
Patient returning call [phone] mobile and [phone] home #

## 2018-10-04 NOTE — Telephone Encounter (Signed)
The preferred imaging location that was put in - was for 'Internal'.  So no, they would not have called because no one can see this order.  It should have been put in for [facility].  She can call and set up the appt - [phone]

## 2018-10-07 NOTE — Telephone Encounter (Signed)
Spoke to pt told her sorry, there was a mix up with the order so Imaging could not see to call you to schedule. Please call to schedule an appt at [phone] at your convience. Pt verbalized understanding.

## 2018-10-13 ENCOUNTER — Ambulatory Visit: Payer: Commercial Managed Care - PPO | Admitting: Family Medicine

## 2018-10-19 ENCOUNTER — Ambulatory Visit
Admission: RE | Admit: 2018-10-19 | Discharge: 2018-10-19 | Disposition: A | Discharge status: Home / Self Care | Payer: Commercial Managed Care - PPO | Source: Ambulatory Visit | Attending: Family Medicine | Admitting: Family Medicine

## 2018-10-19 DIAGNOSIS — R1011 Right upper quadrant pain: Secondary | ICD-10-CM

## 2018-10-22 ENCOUNTER — Ambulatory Visit: Payer: Commercial Managed Care - PPO | Admitting: Family Medicine

## 2018-10-28 ENCOUNTER — Other Ambulatory Visit: Payer: Self-pay

## 2018-10-28 DIAGNOSIS — R1011 Right upper quadrant pain: Secondary | ICD-10-CM

## 2018-10-28 NOTE — Progress Notes (Signed)
amb  

## 2018-10-29 ENCOUNTER — Ambulatory Visit (INDEPENDENT_AMBULATORY_CARE_PROVIDER_SITE_OTHER): Payer: Commercial Managed Care - PPO | Admitting: Family Medicine

## 2018-10-29 ENCOUNTER — Encounter: Payer: Self-pay | Admitting: Family Medicine

## 2018-10-29 ENCOUNTER — Other Ambulatory Visit: Payer: Self-pay

## 2018-10-29 VITALS — BP 96/60 | HR 76 | Temp 98.1°F | Ht 65.0 in | Wt 140.0 lb

## 2018-10-29 DIAGNOSIS — E8881 Metabolic syndrome: Secondary | ICD-10-CM

## 2018-10-29 DIAGNOSIS — R208 Other disturbances of skin sensation: Secondary | ICD-10-CM | POA: Diagnosis not present

## 2018-10-29 DIAGNOSIS — R1011 Right upper quadrant pain: Secondary | ICD-10-CM | POA: Diagnosis not present

## 2018-10-29 DIAGNOSIS — E88819 Insulin resistance, unspecified: Secondary | ICD-10-CM

## 2018-10-29 DIAGNOSIS — Z0000 Encounter for general adult medical examination without abnormal findings: Secondary | ICD-10-CM

## 2018-10-29 NOTE — Progress Notes (Signed)
Andrea Berg is a 48 y.o. female is here for CPE.  History of Present Illness:   HPI: Patient wants to discuss labs and recent images.  She continues to have intermittent burning pain of her tongue.  No new changes.  She did feel that the nystatin swish and spit helps with her discomfort.  She accepts the referral to GI to work-up her pain consistent with biliary etiology.  Mom with history of poorly controlled diabetes.  The patient understands that she is now in the very low range of prediabetes risk.  Health Maintenance Due  Topic Date Due  . INFLUENZA VACCINE  10/30/2018   [facility] 2[facility] 10/29/2018 12/28/2017 09/23/2016  Decreased Interest 0 0 0  Down, Depressed, Hopeless 0 0 0  PHQ - 2 Score 0 0 0  Altered sleeping 3 - -  Tired, decreased energy 0 - -  Change in appetite 0 - -  Feeling bad or failure about yourself  0 - -  Trouble concentrating 0 - -  Moving slowly or fidgety/restless 0 - -  Suicidal thoughts 0 - -  PHQ-9 Score 3 - -  Difficult doing work/chores Not difficult at all - -   PMHx, SurgHx, SocialHx, FamHx, Medications, and Allergies were reviewed in the Visit Navigator and updated as appropriate.   Patient Active Problem List   Diagnosis Date Noted  . Localization-related (focal) (partial) epilepsy and epileptic syndromes with complex partial seizures, with intractable epilepsy 10/19/2013  . Other specified congenital anomalies of brain 10/19/2013   Social History   Tobacco Use  . Smoking status: Never Smoker  . Smokeless tobacco: Never Used  Substance Use Topics  . Alcohol use: No  . Drug use: No   Current Medications and Allergies   .  acetaminophen (TYLENOL) 325 MG tablet, Take 650 mg by mouth every 6 (six) hours as needed., Disp: , Rfl:  .  ibuprofen (ADVIL,MOTRIN) 100 MG tablet, Take 100 mg by mouth every 6 (six) hours as needed for fever., Disp: , Rfl:  .  levETIRAcetam (KEPPRA) 500 MG tablet, Take 1 tablet (500 mg total) by mouth 2 (two)  times daily., Disp: 180 tablet, Rfl: 3 .  Multiple Vitamin (MULTIVITAMIN) capsule, Take 1 capsule by mouth daily., Disp: , Rfl:  .  nystatin (MYCOSTATIN) 100000 UNIT/ML suspension, Take 5 mLs (500,000 Units total) by mouth 4 (four) times daily., Disp: 120 mL, Rfl: 0   Allergies  Allergen Reactions  . Levaquin [Levofloxacin] Nausea And Vomiting  . Nexium [Esomeprazole] Nausea And Vomiting    Dizziness.    Review of Systems   Pertinent items are noted in the HPI. Otherwise, a complete ROS is negative.  Vitals   Vitals:   10/29/18 1423  BP: 96/60  Pulse: 76  Temp: 98.1 F (36.7 C)  TempSrc: Temporal  SpO2: 98%  Weight: 140 lb (63.5 kg)  Height: 5\' 5"  (1.651 m)     Body mass index is 23.3 kg/m.  Physical Exam   Physical Exam Vitals signs and nursing note reviewed.  Constitutional:      General: She is not in acute distress.    Appearance: She is well-developed.  HENT:     Head: Normocephalic and atraumatic.     Right Ear: Tympanic membrane, ear canal and external ear normal.     Left Ear: Tympanic membrane, ear canal and external ear normal.     Nose: Nose normal.     Mouth/Throat:     Mouth: Mucous  membranes are moist.  Eyes:     Conjunctiva/sclera: Conjunctivae normal.     Pupils: Pupils are equal, round, and reactive to light.  Neck:     Musculoskeletal: Normal range of motion and neck supple.     Thyroid: No thyromegaly.  Cardiovascular:     Rate and Rhythm: Normal rate and regular rhythm.     Heart sounds: Normal heart sounds.  Pulmonary:     Effort: Pulmonary effort is normal.     Breath sounds: Normal breath sounds.  Abdominal:     General: Bowel sounds are normal.     Palpations: Abdomen is soft.  Musculoskeletal: Normal range of motion.  Lymphadenopathy:     Cervical: No cervical adenopathy.  Skin:    General: Skin is warm and dry.     Capillary Refill: Capillary refill takes less than 2 seconds.  Neurological:     Mental Status: She is alert  and oriented to person, place, and time.  Psychiatric:        Behavior: Behavior normal.    Results for orders placed or performed in visit on 09/09/18  Vitamin B12  Result Value Ref Range   Vitamin B-12 376 211 - 911 pg/mL  TSH  Result Value Ref Range   TSH 1.38 0.35 - 4.50 uIU/mL  Iron, TIBC and Ferritin Panel  Result Value Ref Range   Iron 43 40 - 190 mcg/dL   TIBC 960294 454250 - 098450 mcg/dL (calc)   %SAT 15 (L) 16 - 45 % (calc)   Ferritin 32 16 - 232 ng/mL  Hemoglobin A1c  Result Value Ref Range   Hgb A1c MFr Bld 5.7 4.6 - 6.5 %  Comprehensive metabolic panel  Result Value Ref Range   Sodium 138 135 - 145 mEq/L   Potassium 4.0 3.5 - 5.1 mEq/L   Chloride 104 96 - 112 mEq/L   CO2 26 19 - 32 mEq/L   Glucose, Bld 97 70 - 99 mg/dL   BUN 9 6 - 23 mg/dL   Creatinine, Ser 1.190.61 0.40 - 1.20 mg/dL   Total Bilirubin 0.4 0.2 - 1.2 mg/dL   Alkaline Phosphatase 57 39 - 117 U/L   AST 12 0 - 37 U/L   ALT 10 0 - 35 U/L   Total Protein 6.6 6.0 - 8.3 g/dL   Albumin 3.8 3.5 - 5.2 g/dL   Calcium 9.0 8.4 - 14.710.5 mg/dL   GFR 829.56105.28 >21.30>60.00 mL/min  CBC with Differential/Platelet  Result Value Ref Range   WBC 7.1 4.0 - 10.5 K/uL   RBC 3.77 (L) 3.87 - 5.11 Mil/uL   Hemoglobin 11.6 (L) 12.0 - 15.0 g/dL   HCT 86.533.8 (L) 78.436.0 - 69.646.0 %   MCV 89.6 78.0 - 100.0 fl   MCHC 34.2 30.0 - 36.0 g/dL   RDW 29.513.2 28.411.5 - 13.215.5 %   Platelets 215.0 150.0 - 400.0 K/uL   Neutrophils Relative % 56.2 43.0 - 77.0 %   Lymphocytes Relative 36.7 12.0 - 46.0 %   Monocytes Relative 6.1 3.0 - 12.0 %   Eosinophils Relative 0.7 0.0 - 5.0 %   Basophils Relative 0.3 0.0 - 3.0 %   Neutro Abs 4.0 1.4 - 7.7 K/uL   Lymphs Abs 2.6 0.7 - 4.0 K/uL   Monocytes Absolute 0.4 0.1 - 1.0 K/uL   Eosinophils Absolute 0.0 0.0 - 0.7 K/uL   Basophils Absolute 0.0 0.0 - 0.1 K/uL   [place] Abdomen Limited Ruq  Result Date: 10/19/2018 CLINICAL DATA:  Right upper quadrant pain. EXAM: ULTRASOUND ABDOMEN LIMITED RIGHT UPPER QUADRANT COMPARISON:  No  recent prior. FINDINGS: Gallbladder: No gallstones or wall thickening visualized. No sonographic Murphy sign noted by sonographer. Common bile duct: Diameter: 8.8 mm Liver: No focal lesion identified. Within normal limits in parenchymal echogenicity. Portal vein is patent on color Doppler imaging with normal direction of blood flow towards the liver. IMPRESSION: 1. Common bile duct is prominent 8.8 mm. Correlation with LFTs suggested. MRI of the abdomen with MRCP can be obtained for further evaluation. 2.  No gallstones or focal hepatic abnormality. Electronically Signed   By: Marcello Moores  Register   On: 10/19/2018 14:07   Assessment and Plan   Andrea Berg was seen today for lab review and mouth and tongue buring still.  Diagnoses and all orders for this visit:  Routine physical examination  RUQ pain Comments: With abnormal finding on ultrasound.  MRI recommended.  Will ask gastroenterology to evaluate and monitor.  Burning sensation of mouth Comments: Nystatin was helpful.  No apparent thrush on exam today though.  May continue with treatment.  Supplement B12.  GI to evaluate as well.  Insulin resistance Comments: Very mild and likely not contributing to her symptoms.  With her family history, she will continue to monitor.    Patient Counseling:   [x]     Nutrition: Stressed importance of moderation in sodium/caffeine intake, saturated fat and cholesterol, caloric balance, sufficient intake of fresh fruits, vegetables, fiber, calcium, iron, and 1 mg of folate supplement per day (for females capable of pregnancy).   [x]      Stressed the importance of regular exercise.    [x]     Substance Abuse: Discussed cessation/primary prevention of tobacco, alcohol, or other drug use; driving or other dangerous activities under the influence; availability of treatment for abuse.    [x]      Injury prevention: Discussed safety belts, safety helmets, smoke detector, smoking near bedding or upholstery.     [x]      Sexuality: Discussed sexually transmitted diseases, partner selection, use of condoms, avoidance of unintended pregnancy  and contraceptive alternatives.    [x]     Dental health: Discussed importance of regular tooth brushing, flossing, and dental visits.   [x]      Health maintenance and immunizations reviewed. Please refer to Health maintenance section.    . Orders and follow up as documented in [place], reviewed diet, exercise and weight control, cardiovascular risk and specific lipid/LDL goals reviewed, reviewed medications and side effects in detail.  . Reviewed expectations re: course of current medical issues. . Outlined signs and symptoms indicating need for more acute intervention. . Patient verbalized understanding and all questions were answered. . Patient received an After Visit Summary.   Briscoe Deutscher, DO [place], [place] 11/07/2018

## 2018-11-01 ENCOUNTER — Encounter: Payer: Self-pay | Admitting: Gastroenterology

## 2018-11-16 ENCOUNTER — Other Ambulatory Visit: Payer: Self-pay | Admitting: Neurology

## 2018-11-22 ENCOUNTER — Encounter: Payer: Self-pay | Admitting: Neurology

## 2018-11-22 ENCOUNTER — Ambulatory Visit (INDEPENDENT_AMBULATORY_CARE_PROVIDER_SITE_OTHER): Payer: Commercial Managed Care - PPO | Admitting: Neurology

## 2018-11-22 ENCOUNTER — Other Ambulatory Visit: Payer: Self-pay

## 2018-11-22 VITALS — BP 110/80 | HR 76 | Temp 98.2°F | Wt 139.0 lb

## 2018-11-22 DIAGNOSIS — R569 Unspecified convulsions: Secondary | ICD-10-CM

## 2018-11-22 MED ORDER — LEVETIRACETAM 500 MG PO TABS: 500.0000 mg | ORAL_TABLET | Freq: Two times a day (BID) | ORAL | 3 refills | Status: DC

## 2018-11-22 NOTE — Progress Notes (Signed)
[facility] [address] [place] [address] [phone]       OFFICE FOLLOW UP VISIT NOTE  [name] Date of Birth:  07/07/1970 Medical Record Number:  [id number]   Referring MD:  London Pepper  Reason for Referral:  seizures  HPI: 45 year [place] lady who recently returned from [place] where she had  hospitalization for a repetitive seizures and abnormal MRI scan of the brain. She is accompanied today by husband and has brought with her extensive hospital records, several MRI scans and lab results which I have  personally reviewed. She started feeling sick   actually the first week of June 2015 several days prior to going to [place]. She was felt to have a sinus infection and treated with Augmentin for sinusitis and Zofran for nausea. She felt tired after this and the day prior to her trip her husband noted that she looked pale and had head was turned to one side and she'll not fully responsive. EMS were called and patient vomited. She was taken to the long emergency room where she was felt to have near syncope and dehydration. She was treated with IV fluids. CT scan of the head, x-rays EKG and lab work were unremarkable. She was discharged on and felt better next morning after taking Sudafed. She traveled to [place] up with felt unwell. She started hearing voices in her head which were talking to each other. She is more able to concentrate and spent time with relatives. She felt better after she rested. Several days later after she had stayed up late in the night the husband noticed that she will have a generalized clonic tonic seizure. She was noted as morning with tossing and turning with teeth clenching. She was taken to the  [facility] hospital where she was diagnosed having seizures and treated with diazepam. She fell asleep. Next-day she was asked to see a neurologist who advised an MRI scan and EEG. She started having speech difficulties and  stopping  midsentence   and unable to complete sentences. The patient's husband [name] L4 medial which have looked at in which patient is making vocalizations but not able to form words. Patient was subsequently hospitalized and underwent a spinal tap which was reportedly normal and an MRI scan of the brain which showed weak diffusion positive lesion in the left medial frontal lobe cingulate gyrus. There were no ADC map images.This lesion was also seen on T2 and flair images and   a postcontrast MRI showed a venous angioma adjacent to this lesion. Lesion was felt to represent cortical dysplasia versus postictal changes from repititive seizures.. The patient was initially started on Trileptal and phenytoin but continued to have auditory hallucinations and some speech and word finding difficulties but eventually she was switched to [place] and she had a miraculous response to this and voices in her head stopped and her speech improved and she's not had any further seizures since then . She however has had some paranoid ideation and is having thoughts of fears of separation from her family and thoughts like ironing clothes we'll start a fire. She is currently taking Keppra 500 twice daily and Trileptal 300 twice daily. She had lab work done on 10/18/13 which showed low sodium of 133 but normal WBC and electrolytes and liver functions otherwise. Update 12/30/2013 : She returns for followup after last visit 3 months ago. She is doing well and has not had any recurrent seizures. She has tapered and  discontinued Trileptal. She is currently on Keppra 500 twice daily. She does complain of some excessive sleepiness and tiredness. She is continues to have mild short-term memory and cognitive difficulties but some of these 3 seated her seizures. Husband is concerned that at times she forgets recent conversations and events. She had EEG done which I have personally reviewed and was normal. A followup MRI scan of the brain done on 12/08/13  personally reviewed shows left frontal small venous angioma without evidence of significant hemorrhage. The left cingulate gyrus swelling and cytotoxic edema have completely resolved compared with previous MRI from June 2015 from [place] Update 08/11/2014 : She returns for follow-up after last visit 7 months ago. She continues to do well without any recurrent seizures. She has been quite compliant with Keppra which she is taking regularly. She is tolerating it well without significant side effects. She does complain of slight fatigue as well as palpitations but this may be related to underlying anxiety and some element of posttraumatic stress. She has not been participating in any regular activities for stress relaxation. Update 02/21/2015 : She returns for follow-up after last visit 6 months ago. She is accompanied by husband. She continues to do well without recurrent seizures. She has been compliant with Keppra which she is taking regularly and tolerating it quite well. She continues to have slight fatigue as well as anxiety. She has not been participating in regular activities for stress laxation. She has questions about how long she needs to stay on Keppra and I spent a lot of time counseling patient and her husband the need for long-term anticonvulsants given the fact she had multiple seizures Update 08/20/2015 : She returns for follow-up after last visit 6 months ago. She is accompanied by her husband. She is doing well and has not had recurrent seizures now for nearly 2 years. The she is tolerating Keppra 500 mg twice daily quite well without any side effects. Did have a transient episode of pain in the left hand and does have occasional knee and joint pains but otherwise is doing quite well. She wants to lose weight but has not been successful. She has no new complaints today. She and her husband again had questions about the need for and duration of long-term anti-convulsant therapy. Update 09/02/2016 ; She  returns for follow-up after last visit a year ago. She continues to do well without recurrent seizures now for 3 years. She is starting Keppra 500 mg twice daily without any side effects. She has recently started working as a Lawyer. She is very careful and regular with her sleeping and eating habits and avoids driving at night. She has no new neurological complaints today. She has had no new health problems since last 1 year. Update 11/12/2017 : She returns for follow-up after last visit a year ago. She continues to do well and hasn't not had recurrent seizures for greater than 3 years. She is tolerating Keppra 500 mg twice daily without any side effects. She has no neurological complaints no new health problems since last year. She plans to see her primary care physician for new complaints of knee pain which are not quite bothersome. Update 11/22/2018 : She returns for follow-up after last visit a year ago.  She is doing well and has not had recurrent seizures now for 4 years.  She remains on Keppra 500 mg twice daily which is tolerating well without any side effects.  She has no neurological complaints today.  She has  been having some chronic right-sided abdominal pain and saw her primary physician who has recently referred her to gastroenterologist that appointment has not yet happened.  She is working from home due to the coronavirus pandemic.   ROS:   14 system review of systems is positive for no complaints today and all other systems negative. PMH:  Past Medical History:  Diagnosis Date  . Fatigue   . Hyperactive   . Lower half migraine   . Palpitations   . Seizures (HCC)     Social History:  Social History   Socioeconomic History  . Marital status: Married    Spouse name: Not on file  . Number of children: 2  . Years of education: degree  . Highest education level: Not on file  Occupational History  . Occupation: school    Employer: UNEMPLOYED  Social Needs  . Financial  resource strain: Not on file  . Food insecurity    Worry: Not on file    Inability: Not on file  . Transportation needs    Medical: Not on file    Non-medical: Not on file  Tobacco Use  . Smoking status: Never Smoker  . Smokeless tobacco: Never Used  Substance and Sexual Activity  . Alcohol use: No  . Drug use: No  . Sexual activity: Yes  Lifestyle  . Physical activity    Days per week: Not on file    Minutes per session: Not on file  . Stress: Not on file  Relationships  . Social Musicianconnections    Talks on phone: Not on file    Gets together: Not on file    Attends religious service: Not on file    Active member of club or organization: Not on file    Attends meetings of clubs or organizations: Not on file    Relationship status: Not on file  . Intimate partner violence    Fear of current or ex partner: Not on file    Emotionally abused: Not on file    Physically abused: Not on file    Forced sexual activity: Not on file  Other Topics Concern  . Not on file  Social History Narrative   Patient lives with her husband and 2 children   Patient is right handed    Patient drunks tea    Medications:   Current Outpatient Medications on File Prior to Visit  Medication Sig Dispense Refill  . acetaminophen (TYLENOL) 325 MG tablet Take 650 mg by mouth every 6 (six) hours as needed.    Marland Kitchen. ibuprofen (ADVIL,MOTRIN) 100 MG tablet Take 100 mg by mouth every 6 (six) hours as needed for fever.    . nystatin (MYCOSTATIN) 100000 UNIT/ML suspension Take 5 mLs (500,000 Units total) by mouth 4 (four) times daily. 120 mL 0  . Prenatal Vit-Fe Fumarate-FA (PRENATAL MULTIVITAMIN) TABS tablet Take 1 tablet by mouth daily at 12 noon.     No current facility-administered medications on file prior to visit.     Allergies:   Allergies  Allergen Reactions  . Levaquin [Levofloxacin] Nausea And Vomiting  . Nexium [Esomeprazole] Nausea And Vomiting    Dizziness.     Physical Exam General: well  developed, well nourished young  [place] lady, seated, in no evident distress Head: head normocephalic and atraumatic.   Neck: supple with no carotid or supraclavicular bruits Cardiovascular: regular rate and rhythm, no murmurs Musculoskeletal: no deformity Skin:  no rash/petichiae Vascular:  Normal pulses all extremities  Vitals:   11/22/18 1119  BP: 110/80  Pulse: 76  Temp: 98.2 F (36.8 C)    Neurologic Exam Mental Status: Awake and fully alert. Oriented to place and time. Recent and remote memory intact. . Attention span, concentration and fund of knowledge appropriate. Mood and affect appropriate. No paranoid ideation noted. Mini-Mental Status exam not done. Cranial Nerves: Fundoscopic exam not done . Pupils equal, briskly reactive to light. Extraocular movements full without nystagmus. Visual fields full to confrontation. Hearing intact. Facial sensation intact. Face, tongue, palate moves normally and symmetrically.  Motor: Normal bulk and tone. Normal strength in all tested extremity muscles. Sensory.: intact to touch and pinprick and vibratory sensation.  Coordination: Rapid alternating movements normal in all extremities. Finger-to-nose and heel-to-shin performed accurately bilaterally. Gait and Station: Arises from chair without difficulty. Stance is normal. Gait demonstrates normal stride length and balance . Able to heel, toe and tandem walk without difficulty.  Reflexes: 1+ and symmetric. Toes downgoing.      ASSESSMENT: 6 year [place] lady with recent episodes of complex partial, simple partial and partial onset seizures with secondary generalization in June 2015 symptomatic from left frontal lesion etiology indeterminate. Adjacent venous angioma with  post ictal MRI changes from repetitive seizures. Seizures seem well controlled    PLAN: I had a long discussion with the patient  regarding her seizures which appear to be well controlled on the  current medication regimen of Keppra 500 mg twice daily and she has been seizure-free for now greater than 3 years.. We discussed the risk for seizure recurrence and the need for compliance and adherence with long-term anticonvulsants. She was given a refill of Keppra for a year and advised to return for follow-up in a year or call earlier if necessary . Greater than 50% time during this 25 minute visit was spent on counseling and coordination of care about seizures and prevention of recurrence. Antony Contras, MD    Note: This document was prepared with digital dictation and possible smart phrase technology. Any transcriptional errors that result from this process are unintentional.

## 2018-11-22 NOTE — Patient Instructions (Signed)
I had a long discussion with the patient regarding her seizures which appear to be well controlled on the current medication regimen of Keppra 500 mg twice daily and she has been seizure-free for now greater than 3 years.. We discussed the risk for seizure recurrence and the need for compliance and adherence with long-term anticonvulsants. She was given a refill of Keppra for a year and advised to return for follow-up in a year or call earlier if necessary .

## 2018-11-29 ENCOUNTER — Ambulatory Visit: Payer: Self-pay | Admitting: *Deleted

## 2018-11-29 NOTE — Telephone Encounter (Signed)
Spoke with patient, she states her husband was diagnosed with Shingles and she wants to know if it would be recommended for her to get the Shingrix vaccine.  Advised her that if she got the vaccine it would not be fully effective at preventing shingles until around 4 weeks after receiving the second dose, so that would help protect her now when her husband might be contagious.  Also, advised her to check with her doctor on her recommendations because Shingrix vaccine is recommended at age 62 and above, and patient is 21.  Patient agreeable, and has no further questions.

## 2018-11-29 NOTE — Telephone Encounter (Signed)
Message from Loma Boston sent at 11/29/2018 4:49 PM EDT  Summary: Fu   pls reach out to pt concerning issues of the Shingx shot. [phone]

## 2018-11-30 ENCOUNTER — Ambulatory Visit: Payer: Commercial Managed Care - PPO | Admitting: Gastroenterology

## 2018-12-28 ENCOUNTER — Ambulatory Visit (INDEPENDENT_AMBULATORY_CARE_PROVIDER_SITE_OTHER): Payer: Commercial Managed Care - PPO | Admitting: Gastroenterology

## 2018-12-28 ENCOUNTER — Encounter: Payer: Self-pay | Admitting: Gastroenterology

## 2018-12-28 VITALS — BP 120/70 | HR 89 | Temp 98.7°F | Ht 65.0 in | Wt 139.0 lb

## 2018-12-28 DIAGNOSIS — R1011 Right upper quadrant pain: Secondary | ICD-10-CM

## 2018-12-28 MED ORDER — FAMOTIDINE 20 MG PO TABS: 20.0000 mg | ORAL_TABLET | Freq: Two times a day (BID) | ORAL | 3 refills | Status: DC

## 2018-12-28 NOTE — Progress Notes (Signed)
Referring Provider: Briscoe Deutscher, DO Primary Care Physician:  Briscoe Deutscher, DO  Reason for Consultation: Right upper quadrant pain   IMPRESSION:  Intermittent, fleeting RUQ pain x 10 years, worse this year  Abdominal ultrasound: common bile duct 8.8 mm No known family history of colon cancer or polyps  The clinical implications of the 8.8 mm common bile duct are unclear.  However, she has had intermittent fleeting right upper quadrant pain for 10 years but has recently progressed.  No alarm features.    Hepatobiliary etiologies of her pain must be considered.  However, the differential also includes reflux, esophageal motility disorders, functional dyspepsia, gastritis including H. pylori, duodenitis, functional abdominal pain, and bacterial overgrowth.  Her symptoms are atypical for abdominal migraine or abdominal epilepsy.  Given this differential I have recommended an MRI with MRCP for further evaluation of her bile duct and an upper endoscopy with esophageal and gastric biopsies.  In the meantime will trial famotidine 20 mg twice daily.   PLAN: Trial of famotidine 20 mg BID EGD MRI with MRCP  Please see the "Patient Instructions" section for addition details about the plan.  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  The history is obtained to the patient review of her electronic health record.  She has a history of migraines, seizures last 4 years ago, palpitations, and fatigue.  Longstanding history of pain and burning in the RUQ. Initially evaluated 10 years ago with an ultrasound. Told that if it was a gallstone it was too small to do anything about it.  The intermittent, non-radiating RUQ slow burning pain has intensified this year. Lasts for a minute. Feels "acidic."  No change with eating, defecation, or position. Most pronounced when she is late eating a meal. Cool  water will provide some relief.  No identified food triggers. Stopped tea with milk and now limits her tea to two cups. She is trying to follow a strict meal schedule to minimize her symptoms.  No other associated symptoms.  Normal appetite.  Weight is stable.  No change in bowel habits.  No identified exacerbating or relieving features. Evaluated by Dr. Juleen China with an ultrasound and labs.  The patient is concerned there is a bile duct abnormality that needs to be addressed given her recent ultrasound and her ultrasound 10 years ago.  Also reports an intermittent burning on her tongue and mouth and she wonders if these are related.  She has a sore on her lip that she forgot to mention to Dr. Juleen China because she was wearing a facemask at the time of her visit. A telehealth encounter told her it was cyst and that she should be evaluated by her PCP.   Uses ibuprofen on rare occasions.  Denies regular NSAID use.  Abdominal imaging: An abdominal ultrasound 10/19/2018 showed a common bile duct at 8.8 mm.  No gallstones or focal hepatic abnormality seen.  Recent labs: Labs 09/09/2018: Normal comprehensive metabolic panel including liver enzymes with an ALT of 10, AST 12, alk phos 57, total bilirubin 0.4, albumin 3.8, total protein 6.6.  Iron 43, ferritin 32, percent saturation 15.  White count 7.1, hemoglobin 11.6, MCV 89.6, RDW 13.2, platelets 215.  No known family history of colon cancer or polyps. No family history of uterine/endometrial cancer, pancreatic cancer or gastric/stomach cancer.   Past Medical History:  Diagnosis Date  . Fatigue   . Hyperactive   .  Lower half migraine   . Palpitations   . Seizures ([place])     No past surgical history on file.  Current Outpatient Medications  Medication Sig Dispense Refill  . acetaminophen (TYLENOL) 325 MG tablet Take 650 mg by mouth every 6 (six) hours as needed.    Marland Kitchen ibuprofen (ADVIL,MOTRIN) 100 MG tablet Take 100 mg by mouth every 6 (six) hours as  needed for fever.    . levETIRAcetam (KEPPRA) 500 MG tablet Take 1 tablet (500 mg total) by mouth 2 (two) times daily. 180 tablet 3  . nystatin (MYCOSTATIN) 100000 UNIT/ML suspension Take 5 mLs (500,000 Units total) by mouth 4 (four) times daily. 120 mL 0  . Prenatal Vit-Fe Fumarate-FA (PRENATAL MULTIVITAMIN) TABS tablet Take 1 tablet by mouth daily at 12 noon.     No current facility-administered medications for this visit.     Allergies as of 12/28/2018 - Review Complete 10/29/2018  Allergen Reaction Noted  . Levaquin [levofloxacin] Nausea And Vomiting 09/03/2013  . Nexium [esomeprazole] Nausea And Vomiting 09/03/2013    Family History  Problem Relation Age of Onset  . Heart attack Father     Social History   Socioeconomic History  . Marital status: Married    Spouse name: Not on file  . Number of children: 2  . Years of education: degree  . Highest education level: Not on file  Occupational History  . Occupation: school    Employer: UNEMPLOYED  Social Needs  . Financial resource strain: Not on file  . Food insecurity    Worry: Not on file    Inability: Not on file  . Transportation needs    Medical: Not on file    Non-medical: Not on file  Tobacco Use  . Smoking status: Never Smoker  . Smokeless tobacco: Never Used  Substance and Sexual Activity  . Alcohol use: No  . Drug use: No  . Sexual activity: Yes  Lifestyle  . Physical activity    Days per week: Not on file    Minutes per session: Not on file  . Stress: Not on file  Relationships  . Social Herbalist on phone: Not on file    Gets together: Not on file    Attends religious service: Not on file    Active member of club or organization: Not on file    Attends meetings of clubs or organizations: Not on file    Relationship status: Not on file  . Intimate partner violence    Fear of current or ex partner: Not on file    Emotionally abused: Not on file    Physically abused: Not on file     Forced sexual activity: Not on file  Other Topics Concern  . Not on file  Social History Narrative   Patient lives with her husband and 2 children   Patient is right handed    Patient drunks tea    Review of Systems: 12 system ROS is negative except as noted above.   Physical Exam: Physical Exam: General:   Alert,  well-nourished, pleasant and cooperative in NAD.  Eyes are open. Head:  Normocephalic and atraumatic. Eyes:  Sclera clear, no icterus.   Conjunctiva pink. Ears:  Normal auditory acuity. Nose:  No deformity, discharge,  or lesions. Mouth:  No deformity or lesions.   Neck:  Supple; no masses or thyromegaly. Lungs:  Clear throughout to auscultation.   No wheezes. Heart:  Regular rate and rhythm; no  murmurs. Abdomen:  Soft,nontender, nondistended, normal bowel sounds, no rebound or guarding. No hepatosplenomegaly.  No obvious ascites.  No abdominal wall hernias.  No succession splash.  No Murphy sign. Rectal:  Deferred  Msk:  Symmetrical. No boney deformities LAD: No inguinal or umbilical LAD Extremities:  No clubbing or edema. Neurologic:  Alert and  oriented x4;  grossly nonfocal Skin:  Intact without significant lesions or rashes. Psych:  Alert and cooperative. Normal mood and affect.    Andrea Penny L. Tarri Glenn, MD, MPH 12/28/2018, 12:31 PM

## 2018-12-28 NOTE — Patient Instructions (Addendum)
I have recommended a trial of famotidine 20 mg twice daily.  I have recommended MRI with MRCP to further evaluate your bile duct.  I am also recommending an upper endoscopy to further evaluate your pain given the relationship to food and water.  Please call with any questions or concerns in the meantime.   You have been scheduled for an MRI at [facility] on 01-03-2019. Your appointment time is 5:00 pm. Please arrive 30 minutes prior to your appointment time for registration purposes. Please make certain not to have anything to eat or drink 4 hours prior to your test. In addition, if you have any metal in your body, have a pacemaker or defibrillator, please be sure to let your ordering physician know. This test typically takes 45 minutes to 1 hour to complete. Should you need to reschedule, please call [phone] to do so.

## 2018-12-29 ENCOUNTER — Encounter: Payer: Self-pay | Admitting: Gastroenterology

## 2018-12-29 ENCOUNTER — Telehealth: Payer: Self-pay | Admitting: Gastroenterology

## 2018-12-29 ENCOUNTER — Other Ambulatory Visit: Payer: Self-pay | Admitting: Gastroenterology

## 2018-12-29 NOTE — Telephone Encounter (Signed)
Spoke to the patient, answered all questions. Most of her questions about were about prepping for the EGD. No other questions or concerns voiced at the time of the call.

## 2018-12-29 NOTE — Addendum Note (Signed)
Addended by: Wyline Beady on: 12/29/2018 08:53 AM   Modules accepted: Orders

## 2018-12-29 NOTE — Addendum Note (Signed)
Addended by: Wyline Beady on: 12/29/2018 08:34 AM   Modules accepted: Orders

## 2019-01-03 ENCOUNTER — Ambulatory Visit (HOSPITAL_COMMUNITY): Payer: Commercial Managed Care - PPO

## 2019-01-04 ENCOUNTER — Encounter: Payer: Commercial Managed Care - PPO | Admitting: Gastroenterology

## 2019-01-04 ENCOUNTER — Ambulatory Visit (AMBULATORY_SURGERY_CENTER): Payer: Commercial Managed Care - PPO | Admitting: Gastroenterology

## 2019-01-04 ENCOUNTER — Other Ambulatory Visit: Payer: Self-pay | Admitting: Gastroenterology

## 2019-01-04 ENCOUNTER — Encounter: Payer: Self-pay | Admitting: Gastroenterology

## 2019-01-04 ENCOUNTER — Other Ambulatory Visit: Payer: Self-pay

## 2019-01-04 VITALS — BP 115/69 | HR 68 | Temp 98.9°F | Resp 18 | Ht 65.0 in | Wt 139.0 lb

## 2019-01-04 DIAGNOSIS — K3189 Other diseases of stomach and duodenum: Secondary | ICD-10-CM

## 2019-01-04 DIAGNOSIS — K2970 Gastritis, unspecified, without bleeding: Secondary | ICD-10-CM

## 2019-01-04 DIAGNOSIS — R1011 Right upper quadrant pain: Secondary | ICD-10-CM

## 2019-01-04 MED ORDER — SODIUM CHLORIDE 0.9 % IV SOLN: 500.0000 mL | Freq: Once | INTRAVENOUS | Status: DC

## 2019-01-04 NOTE — Progress Notes (Signed)
PT taken to PACU. Monitors in place. VSS. Report given to RN. 

## 2019-01-04 NOTE — Progress Notes (Signed)
Pt's states no medical or surgical changes since previsit or office visit.  JB - temp CW - vitals. 

## 2019-01-04 NOTE — Op Note (Signed)
[place] Patient Name: Andrea Berg Procedure Date: 01/04/2019 10:07 AM MRN: [id number] Endoscopist: Thornton Park MD, MD Age: 48 Referring MD:  Date of Birth: 05-12-1970 Gender: Female Account #: [id number] Procedure:                Upper GI endoscopy Indications:              Abdominal pain in the right upper quadrant Medicines:                See the Anesthesia note for documentation of the                            administered medications Procedure:                Pre-Anesthesia Assessment:                           - Prior to the procedure, a History and Physical                            was performed, and patient medications and                            allergies were reviewed. The patient's tolerance of                            previous anesthesia was also reviewed. The risks                            and benefits of the procedure and the sedation                            options and risks were discussed with the patient.                            All questions were answered, and informed consent                            was obtained. Prior Anticoagulants: The patient has                            taken no previous anticoagulant or antiplatelet                            agents. ASA Grade Assessment: II - A patient with                            mild systemic disease. After reviewing the risks                            and benefits, the patient was deemed in                            satisfactory condition to undergo the procedure.  After obtaining informed consent, the endoscope was                            passed under direct vision. Throughout the                            procedure, the patient's blood pressure, pulse, and                            oxygen saturations were monitored continuously. The                            Endoscope was introduced through the mouth, and                            advanced to  the third part of duodenum. The upper                            GI endoscopy was accomplished without difficulty.                            The patient tolerated the procedure well. Scope In: Scope Out: Findings:                 The Z-line was irregular. Biopsies were taken with                            a cold forceps for histology. Estimated blood loss                            was minimal.                           The entire examined stomach was normal. Biopsies                            were taken from the antrum and fundus with a cold                            forceps for histology. Estimated blood loss was                            minimal.                           The examined duodenum was normal. Biopsies were                            taken with a cold forceps for histology. Estimated                            blood loss was minimal. Complications:            No immediate complications. Estimated blood loss:  Minimal. Estimated Blood Loss:     Estimated blood loss was minimal. Impression:               - Z-line irregular. Biopsied.                           - Normal stomach. Biopsied.                           - Normal examined duodenum. Biopsied.                           - No source of abdominal pain identified. Awaiting                            pathology results from esophageal, gastric, and                            duodenal biopsies. Recommendation:           - Patient has a contact number available for                            emergencies. The signs and symptoms of potential                            delayed complications were discussed with the                            patient. Return to normal activities tomorrow.                            Written discharge instructions were provided to the                            patient.                           - Resume previous diet today.                           - Continue  present medications.                           - Await pathology results.                           - Proceed with MR as previously planned.                           - Follow-up in the office to review the pathology                            results from this study and the MR. Tressia Danas MD, MD 01/04/2019 10:31:25 AM This report has been signed electronically.

## 2019-01-04 NOTE — Progress Notes (Signed)
Called to room to assist during endoscopic procedure.  Patient ID and intended procedure confirmed with present staff. Received instructions for my participation in the procedure from the performing physician.  

## 2019-01-04 NOTE — Patient Instructions (Signed)
YOU HAD AN ENDOSCOPIC PROCEDURE TODAY AT THE [facility]:   Refer to the procedure report that was given to you for any specific questions about what was found during the examination.  If the procedure report does not answer your questions, please call your gastroenterologist to clarify.  If you requested that your care partner not be given the details of your procedure findings, then the procedure report has been included in a sealed envelope for you to review at your convenience later.  YOU SHOULD EXPECT: Some feelings of bloating in the abdomen. Passage of more gas than usual.  Walking can help get rid of the air that was put into your GI tract during the procedure and reduce the bloating. If you had a lower endoscopy (such as a colonoscopy or flexible sigmoidoscopy) you may notice spotting of blood in your stool or on the toilet paper. If you underwent a bowel prep for your procedure, you may not have a normal bowel movement for a few days.  Please Note:  You might notice some irritation and congestion in your nose or some drainage.  This is from the oxygen used during your procedure.  There is no need for concern and it should clear up in a day or so.  SYMPTOMS TO REPORT IMMEDIATELY:   Following upper endoscopy (EGD)  Vomiting of blood or coffee ground material  New chest pain or pain under the shoulder blades  Painful or persistently difficult swallowing  New shortness of breath  Fever of 100F or higher  Black, tarry-looking stools  For urgent or emergent issues, a gastroenterologist can be reached at any hour by calling [phone].   DIET:  We do recommend a small meal at first, but then you may proceed to your regular diet.  Drink plenty of fluids but you should avoid alcoholic beverages for 24 hours.  ACTIVITY:  You should plan to take it easy for the rest of today and you should NOT DRIVE or use heavy machinery until tomorrow (because of the sedation medicines used  during the test).    FOLLOW UP: Our staff will call the number listed on your records 48-72 hours following your procedure to check on you and address any questions or concerns that you may have regarding the information given to you following your procedure. If we do not reach you, we will leave a message.  We will attempt to reach you two times.  During this call, we will ask if you have developed any symptoms of COVID 19. If you develop any symptoms (ie: fever, flu-like symptoms, shortness of breath, cough etc.) before then, please call [phone].  If you test positive for Covid 19 in the 2 weeks post procedure, please call and report this information to us.    If any biopsies were taken you will be contacted by phone or by letter within the next 1-3 weeks.  Please call us at [phone] if you have not heard about the biopsies in 3 weeks.    SIGNATURES/CONFIDENTIALITY: You and/or your care partner have signed paperwork which will be entered into your electronic medical record.  These signatures attest to the fact that that the information above on your After Visit Summary has been reviewed and is understood.  Full responsibility of the confidentiality of this discharge information lies with you and/or your care-partner. 

## 2019-01-06 ENCOUNTER — Other Ambulatory Visit: Payer: Self-pay

## 2019-01-06 ENCOUNTER — Ambulatory Visit (HOSPITAL_COMMUNITY)
Admission: RE | Admit: 2019-01-06 | Discharge: 2019-01-06 | Disposition: A | Discharge status: Home / Self Care | Payer: Commercial Managed Care - PPO | Source: Ambulatory Visit | Attending: Gastroenterology | Admitting: Gastroenterology

## 2019-01-06 ENCOUNTER — Other Ambulatory Visit: Payer: Self-pay | Admitting: Gastroenterology

## 2019-01-06 DIAGNOSIS — R1011 Right upper quadrant pain: Secondary | ICD-10-CM

## 2019-01-06 MED ORDER — GADOBUTROL 1 MMOL/ML IV SOLN
6.0000 mL | Freq: Once | INTRAVENOUS | Status: AC | PRN
  Administered 2019-01-06: 12:00:00 6 mL via INTRAVENOUS

## 2019-01-07 ENCOUNTER — Telehealth: Payer: Self-pay | Admitting: *Deleted

## 2019-01-07 NOTE — Telephone Encounter (Signed)
1. Have you developed a fever since your procedure? no  2.   Have you had an respiratory symptoms (SOB or cough) since your procedure? no  3.   Have you tested positive for COVID 19 since your procedure no  4.   Have you had any family members/close contacts diagnosed with the COVID 19 since your procedure?  no   If yes to any of these questions please route to Joylene John, RN and Alphonsa Gin, Therapist, sports.  Follow up Call-  Call back number 01/04/2019  Post procedure Call Back phone  # [phone]  Permission to leave phone message Yes  Some recent data might be hidden     Patient questions:  Do you have a fever, pain , or abdominal swelling? No. Pain Score  0 *  Have you tolerated food without any problems? Yes.    Have you been able to return to your normal activities? Yes.    Do you have any questions about your discharge instructions: Diet   No. Medications  No. Follow up visit  No.  Do you have questions or concerns about your Care? No.  Actions: * If pain score is 4 or above: No action needed, pain <4.

## 2019-01-10 ENCOUNTER — Telehealth: Payer: Self-pay | Admitting: Gastroenterology

## 2019-01-10 NOTE — Telephone Encounter (Signed)
Patient notified that Dr. Tarri Glenn is [place] today and would review the results when she returned to the office. The patient was told that she would be notified of results via a telephone call or a letter. Patient verbalized understanding. No other questions or comments voiced at the conclusion of the call.

## 2019-01-13 ENCOUNTER — Encounter: Payer: Self-pay | Admitting: *Deleted

## 2019-01-13 NOTE — Telephone Encounter (Signed)
Dr. Tarri Glenn, I see you attempted to call this patient about her MRI results. She has returned you phone call.

## 2019-01-13 NOTE — Telephone Encounter (Signed)
Pt stated that she had received a voicemail regarding MRI results.

## 2019-01-13 NOTE — Telephone Encounter (Signed)
Discussed with the patient by phone and documented elsewhere. Thank you.

## 2019-01-19 ENCOUNTER — Ambulatory Visit: Payer: Commercial Managed Care - PPO | Admitting: Gastroenterology

## 2019-01-31 ENCOUNTER — Telehealth: Payer: Self-pay | Admitting: *Deleted

## 2019-01-31 NOTE — Telephone Encounter (Signed)
Followed up with [facility], the patient is scheduled for her consultation with Dr. Barry Dienes on 11/9 at 10:45 am.

## 2019-02-10 ENCOUNTER — Encounter: Payer: Self-pay | Admitting: Gastroenterology

## 2019-02-10 ENCOUNTER — Ambulatory Visit (INDEPENDENT_AMBULATORY_CARE_PROVIDER_SITE_OTHER): Payer: Commercial Managed Care - PPO | Admitting: Gastroenterology

## 2019-02-10 VITALS — BP 100/60 | HR 76 | Temp 97.1°F | Ht 65.0 in | Wt 140.0 lb

## 2019-02-10 DIAGNOSIS — R1011 Right upper quadrant pain: Secondary | ICD-10-CM

## 2019-02-10 DIAGNOSIS — K3189 Other diseases of stomach and duodenum: Secondary | ICD-10-CM

## 2019-02-10 DIAGNOSIS — K31A0 Gastric intestinal metaplasia, unspecified: Secondary | ICD-10-CM

## 2019-02-10 NOTE — Progress Notes (Signed)
Referring Provider: Briscoe Deutscher, DO Primary Care Physician:  Briscoe Deutscher, DO  Reason for Consultation: Right upper quadrant pain   IMPRESSION:  type Ia choledochocele with associated symptoms    - Intermittent, fleeting RUQ pain x 10 years, worse this year     - Abdominal ultrasound: common bile duct 8.8 mm    - MRI/MRCP 10/98/20: type Ia choledochocele Irregular Z-line on EGD 01/04/2019 with biopsies suggesting Barrett's esophagus Burning tongue No prior colon cancer screening No known family history of colon cancer or polyps  Her abdominal pain may be related to a type I choledochocele.  She will have surgery with Dr. Barry Dienes next month.  Hopefully this will resolve her abdominal pain.  I reviewed the pathology results from her 01/04/2019 upper endoscopy suggesting the possibility of Barrett's.  I recommended proton pump inhibitor therapy for at least 6 months with consideration of a repeat EGD with further biopsies obtained at that time to confirm the diagnosis.  Multiple questions regarding the pathology findings and implications were answered for the patient and her husband.  After she left her appointment today I realized that our colon cancer screening guidelines have changed.  She may want to consider having a screening colonoscopy at the time of her repeat upper endoscopy next year.  We briefly discussed burning tongue syndrome today.  There was no Candida or reflux noted on her recent endoscopy. I have encouraged her to follow-up with her primary care provider for further evaluation.  It would also be worth mentioning this to her dentist.  PLAN: Pantoprazole 40 mg QAM EGD in 6-9 months to obtain Barrett's biopsies Surgery planned per Dr. Barry Dienes Follow-up with your dentist regarding your burning tongue Consider screening colonoscopy for colon cancer at the time of her upper endoscopy next year  Please see the "Patient Instructions" section for addition details about the  plan.  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 Her husband accompanies her to this appointment.  She was initially referred by Dr. Juleen China for right upper quadrant abdominal pain. The interval history is obtained through the patient and review of her electronic health record.  She has a history of migraines, seizures last 4 years ago, palpitations, and fatigue.  Longstanding history of pain and burning in the RUQ. Initially evaluated 10 years ago with an ultrasound. Told that if it was a gallstone it was too small to do anything about it. The intermittent, non-radiating RUQ slow burning pain has intensified this year. Lasts for a minute. Feels "acidic."  No change with eating, defecation, or position. Most pronounced when she is late eating a meal. Cool water will provide some relief.  No identified food triggers. Stopped tea with milk and now limits her tea to two cups. She is trying to follow a strict meal schedule to minimize her symptoms.  No other associated symptoms.  Normal appetite.  Weight is stable.  No change in bowel habits.  No identified exacerbating or relieving features. Evaluated by Dr. Juleen China with an ultrasound and labs.  The patient is concerned there is a bile duct abnormality that needs to be addressed given her recent ultrasound and her ultrasound 10 years ago.  EGD to evaluate her pain 01/04/2019 showed an irregular Z-line but was otherwise normal. Esophageal biopsies are consistent with Barrett's esophagus.  Gastric biopsies showed chronic gastritis.  There was no H. pylori.  Duodenal biopsies were normal.  MRI/MRCP 01/06/2019 showed a type I choledochocele.  She is recently evaluated by Dr. Barry Dienes who is planning surgery in December.  She continues to report intermittent burning on her tongue.  She has a sore on her lip that she forgot to mention to Dr. Juleen China because she was wearing a facemask at the time of her visit. A telehealth  encounter told her it was cyst and that she should be evaluated by her PCP.  She has not reviewed the symptoms with her dentist.  She has never seen an ear nose and throat doctor.    Abdominal imaging: Abdominal ultrasound 10/19/2018 showed a common bile duct at 8.8 mm.  No gallstones or focal hepatic abnormality seen. MRI/MRCP 01/06/19: Type I a choledochocele, ascending colon diverticulosis, degenerative changes of L4-5  Recent labs: Labs 09/09/2018: Normal comprehensive metabolic panel including liver enzymes with an ALT of 10, AST 12, alk phos 57, total bilirubin 0.4, albumin 3.8, total protein 6.6.  Iron 43, ferritin 32, percent saturation 15.  White count 7.1, hemoglobin 11.6, MCV 89.6, RDW 13.2, platelets 215.  No known family history of colon cancer or polyps. No family history of uterine/endometrial cancer, pancreatic cancer or gastric/stomach cancer.   Past Medical History:  Diagnosis Date  . Fatigue   . Hyperactive   . Lower half migraine   . Palpitations   . Seizures ([place])     History reviewed. No pertinent surgical history.  Current Outpatient Medications  Medication Sig Dispense Refill  . acetaminophen (TYLENOL) 325 MG tablet Take 650 mg by mouth every 6 (six) hours as needed.    . famotidine (PEPCID) 20 MG tablet Take 1 tablet (20 mg total) by mouth 2 (two) times daily. 60 tablet 3  . ibuprofen (ADVIL,MOTRIN) 100 MG tablet Take 100 mg by mouth every 6 (six) hours as needed for fever.    . levETIRAcetam (KEPPRA) 500 MG tablet Take 1 tablet (500 mg total) by mouth 2 (two) times daily. 180 tablet 3  . Prenatal Vit-Fe Fumarate-FA (PRENATAL MULTIVITAMIN) TABS tablet Take 1 tablet by mouth daily at 12 noon.     No current facility-administered medications for this visit.     Allergies as of 02/10/2019 - Review Complete 02/10/2019  Allergen Reaction Noted  . Levaquin [levofloxacin] Nausea And Vomiting 09/03/2013  . Nexium [esomeprazole] Nausea And Vomiting 09/03/2013     Family History  Problem Relation Age of Onset  . Heart attack Father   . Colon cancer Neg Hx   . Esophageal cancer Neg Hx   . Rectal cancer Neg Hx   . Stomach cancer Neg Hx     Social History   Socioeconomic History  . Marital status: Married    Spouse name: Not on file  . Number of children: 2  . Years of education: degree  . Highest education level: Not on file  Occupational History  . Occupation: school    Employer: UNEMPLOYED  Social Needs  . Financial resource strain: Not on file  . Food insecurity    Worry: Not on file    Inability: Not on file  . Transportation needs    Medical: Not on file    Non-medical: Not on file  Tobacco Use  . Smoking status: Never Smoker  . Smokeless tobacco: Never Used  Substance and Sexual Activity  . Alcohol use: No  . Drug use: No  . Sexual activity: Yes  Lifestyle  . Physical activity    Days per week: Not on file    Minutes per session: Not on file  .  Stress: Not on file  Relationships  . Social Herbalist on phone: Not on file    Gets together: Not on file    Attends religious service: Not on file    Active member of club or organization: Not on file    Attends meetings of clubs or organizations: Not on file    Relationship status: Not on file  . Intimate partner violence    Fear of current or ex partner: Not on file    Emotionally abused: Not on file    Physically abused: Not on file    Forced sexual activity: Not on file  Other Topics Concern  . Not on file  Social History Narrative   Patient lives with her husband and 2 children   Patient is right handed    Patient drunks tea    Review of Systems: 12 system ROS is negative except as noted above.   Physical Exam: Physical Exam: General:   Alert,  well-nourished, pleasant and cooperative in NAD.  Eyes are open. Head:  Normocephalic and atraumatic. Eyes:  Sclera clear, no icterus.   Conjunctiva pink. Ears:  Normal auditory acuity. Nose:  No  deformity, discharge,  or lesions. Mouth:  No deformity or lesions.   Neck:  Supple; no masses or thyromegaly. Lungs:  Clear throughout to auscultation.   No wheezes. Heart:  Regular rate and rhythm; no murmurs. Abdomen:  Soft,nontender, nondistended, normal bowel sounds, no rebound or guarding. No hepatosplenomegaly.  No obvious ascites.  No abdominal wall hernias.  No succession splash.  No Murphy sign. Rectal:  Deferred  Msk:  Symmetrical. No boney deformities LAD: No inguinal or umbilical LAD Extremities:  No clubbing or edema. Neurologic:  Alert and  oriented x4;  grossly nonfocal Skin:  Intact without significant lesions or rashes. Psych:  Alert and cooperative. Normal mood and affect.    Charlee Squibb L. Tarri Glenn, MD, MPH 02/10/2019, 11:38 AM

## 2019-02-10 NOTE — Patient Instructions (Addendum)
I have not changed your medications today.   Please call me in January to give me an update after your surgery.  If you are age 47 or older, your body mass index should be between 23-30. Your Body mass index is 23.3 kg/m. If this is out of the aforementioned range listed, please consider follow up with your Primary Care Provider.  If you are age 8 or younger, your body mass index should be between 19-25. Your Body mass index is 23.3 kg/m. If this is out of the aformentioned range listed, please consider follow up with your Primary Care Provider.

## 2019-02-21 ENCOUNTER — Telehealth: Payer: Self-pay

## 2019-02-21 NOTE — Telephone Encounter (Signed)
Copied from [place] [id number]. Topic: Referral - Question >> Feb 21, 2019 10:32 AM Sheran Luz wrote: Patient is requesting a second opinion from gastro- which requires an additional referral. Patient scheduled for [facility] visit with [facility] on 12/3.

## 2019-02-22 NOTE — Telephone Encounter (Signed)
Please see message and advise of okay to give referral. Pt is scheduled to see you on 12/3 for TOC.

## 2019-02-22 NOTE — Telephone Encounter (Signed)
Patient is calling again because she needs another referral for a second opinion.  CB# [phone] or [phone]

## 2019-02-22 NOTE — Telephone Encounter (Signed)
I would prefer to wait until her appointment so we can discuss.

## 2019-02-22 NOTE — Telephone Encounter (Signed)
Spoke to pt told her Andrea Berg would like to wait on the referral till your appointment to discuss. Pt verbalized understanding.

## 2019-02-22 NOTE — Telephone Encounter (Signed)
See below

## 2019-02-28 ENCOUNTER — Encounter: Payer: Self-pay | Admitting: Physician Assistant

## 2019-02-28 ENCOUNTER — Ambulatory Visit (INDEPENDENT_AMBULATORY_CARE_PROVIDER_SITE_OTHER): Payer: Commercial Managed Care - PPO | Admitting: Physician Assistant

## 2019-02-28 VITALS — Ht 65.0 in | Wt 140.0 lb

## 2019-02-28 DIAGNOSIS — Q445 Other congenital malformations of bile ducts: Secondary | ICD-10-CM

## 2019-02-28 NOTE — Progress Notes (Signed)
Virtual Visit via Video   I connected with Andrea Berg on 02/28/19 at  3:40 PM EST by a video enabled telemedicine application and verified that I am speaking with the correct person using two identifiers. Location patient: Home Location provider: [facility], Office Persons participating in the virtual visit: Andrea Berg, Frenz PA-C, Andrea Pickler, Andrea Berg, Andrea Berg (husband)  I discussed the limitations of evaluation and management by telemedicine and the availability of in person appointments. The patient expressed understanding and agreed to proceed.  I acted as a Education administrator for [employer], [employer], Andrea Berg  Subjective:   HPI:   Pt is following up today transfer of care from Dr. Juleen China. Pt is wanting a referral for second opinion from [place].  She has longstanding history of pain and "burning sensation" in her right upper quadrant.  She was evaluated by a GI doctor recently, Dr. Tarri Glenn.  Briefly, from Dr. Tarri Glenn' note on 02/10/19: "EGD to evaluate her pain 01/04/2019 showed an irregular Z-line but was otherwise normal. Esophageal biopsies are consistent with Barrett's esophagus.  Gastric biopsies showed chronic gastritis.  There was no H. pylori. Duodenal biopsies were normal.   MRI/MRCP 01/06/2019 showed a type I choledochocele.  She is recently evaluated by Dr. Barry Dienes who is planning surgery in December."  Patient and husband are requesting a referral to a different surgeon in order to have a second opinion about treatment of this.  She continues to have abdominal pain, and states that it has slightly changed, and that is now involving her "kidney area."  Denies: Changes in urination, nausea, vomiting, chills, chest pain  ROS: See pertinent positives and negatives per HPI.  Patient Active Problem List   Diagnosis Date Noted  . Localization-related (focal) (partial) epilepsy and epileptic syndromes with complex partial seizures, with intractable epilepsy  10/19/2013  . Other specified congenital anomalies of brain 10/19/2013    Social History   Tobacco Use  . Smoking status: Never Smoker  . Smokeless tobacco: Never Used  Substance Use Topics  . Alcohol use: No    Current Outpatient Medications:  .  acetaminophen (TYLENOL) 325 MG tablet, Take 650 mg by mouth every 6 (six) hours as needed., Disp: , Rfl:  .  famotidine (PEPCID) 20 MG tablet, Take 1 tablet (20 mg total) by mouth 2 (two) times daily., Disp: 60 tablet, Rfl: 3 .  ibuprofen (ADVIL,MOTRIN) 100 MG tablet, Take 100 mg by mouth every 6 (six) hours as needed for fever., Disp: , Rfl:  .  levETIRAcetam (KEPPRA) 500 MG tablet, Take 1 tablet (500 mg total) by mouth 2 (two) times daily., Disp: 180 tablet, Rfl: 3 .  Prenatal Vit-Fe Fumarate-FA (PRENATAL MULTIVITAMIN) TABS tablet, Take 1 tablet by mouth daily at 12 noon., Disp: , Rfl:   Allergies  Allergen Reactions  . Levaquin [Levofloxacin] Nausea And Vomiting  . Nexium [Esomeprazole] Nausea And Vomiting    Dizziness.     Objective:   VITALS: Per patient if applicable, see vitals. GENERAL: Alert, appears well and in no acute distress. HEENT: Atraumatic, conjunctiva clear, no obvious abnormalities on inspection of external nose and ears. NECK: Normal movements of the head and neck. CARDIOPULMONARY: No increased WOB. Speaking in clear sentences. I:E ratio WNL.  MS: Moves all visible extremities without noticeable abnormality. PSYCH: Pleasant and cooperative, well-groomed. Speech normal rate and rhythm. Affect is appropriate. Insight and judgement are appropriate. Attention is focused, linear, and appropriate.  NEURO: CN grossly intact. Oriented as arrived to appointment  on time with no prompting. Moves both UE equally.  SKIN: No obvious lesions, wounds, erythema, or cyanosis noted on face or hands.  Assessment and Plan:   [name] was seen today for transfer of care.  Diagnoses and all orders for this visit:  Choledochocele   Patient is in no apparent distress today.  Per her and her husband's request, I have put in referrals for both surgeons that she would like to see, specifically Dr. Dorothy Puffer and Dr. Chalmers Cater.  Patient understands that if her symptoms worsen in the interim to go to the emergency room immediately.  . Reviewed expectations re: course of current medical issues. . Discussed self-management of symptoms. . Outlined signs and symptoms indicating need for more acute intervention. . Patient verbalized understanding and all questions were answered. Marland Kitchen Health Maintenance issues including appropriate healthy diet, exercise, and smoking avoidance were discussed with patient. . See orders for this visit as documented in the electronic medical record.  I discussed the assessment and treatment plan with the patient. The patient was provided an opportunity to ask questions and all were answered. The patient agreed with the plan and demonstrated an understanding of the instructions.   The patient was advised to call back or seek an in-person evaluation if the symptoms worsen or if the condition fails to improve as anticipated.   CMA or Andrea Berg served as scribe during this visit. History, Physical, and Plan performed by medical provider. The above documentation has been reviewed and is accurate and complete.   Jarold Motto, [place] 02/28/2019

## 2019-03-03 ENCOUNTER — Encounter: Payer: Commercial Managed Care - PPO | Admitting: Physician Assistant

## 2019-03-04 ENCOUNTER — Other Ambulatory Visit: Payer: Self-pay | Admitting: Gastroenterology

## 2019-03-04 ENCOUNTER — Other Ambulatory Visit: Payer: Self-pay | Admitting: Family Medicine

## 2019-03-04 DIAGNOSIS — R208 Other disturbances of skin sensation: Secondary | ICD-10-CM

## 2019-03-09 ENCOUNTER — Encounter: Payer: Self-pay | Admitting: Physician Assistant

## 2019-03-09 DIAGNOSIS — Q444 Choledochal cyst: Secondary | ICD-10-CM | POA: Insufficient documentation

## 2019-03-10 ENCOUNTER — Telehealth: Payer: Self-pay | Admitting: Physician Assistant

## 2019-03-10 NOTE — Telephone Encounter (Signed)
See note  Copied from [place] [id number]. Topic: General - Other >> Mar 10, 2019 10:24 AM Andrea Berg wrote: Patient would like to know if records from [facility] have been faxed to office. Please advise.

## 2019-03-10 NOTE — Telephone Encounter (Signed)
Spoke to Mr. Wible, due to pt not available. Told him to let pt know I have not received any records from [facility]. Mr. Wojdyla verbalized understanding and will follow up with [facility].

## 2019-03-13 ENCOUNTER — Other Ambulatory Visit: Payer: Self-pay | Admitting: Family Medicine

## 2019-03-13 DIAGNOSIS — R208 Other disturbances of skin sensation: Secondary | ICD-10-CM

## 2019-03-16 NOTE — Telephone Encounter (Signed)
Patient calling to inquire if office has received records from [facility] yet. I advised patient to contact them again. Patient asking for office to give her a call when records are received.

## 2019-03-17 ENCOUNTER — Other Ambulatory Visit: Payer: Self-pay | Admitting: Physician Assistant

## 2019-03-17 ENCOUNTER — Ambulatory Visit: Payer: Commercial Managed Care - PPO | Attending: Internal Medicine

## 2019-03-17 ENCOUNTER — Encounter: Payer: Self-pay | Admitting: Physician Assistant

## 2019-03-17 DIAGNOSIS — Z20822 Contact with and (suspected) exposure to covid-19: Secondary | ICD-10-CM

## 2019-03-17 MED ORDER — NYSTATIN 100000 UNIT/ML MT SUSP: 5.0000 mL | Freq: Four times a day (QID) | OROMUCOSAL | 4 refills | Status: DC

## 2019-03-18 LAB — NOVEL CORONAVIRUS, NAA: SARS-CoV-2, NAA: NOT DETECTED

## 2019-03-18 NOTE — Telephone Encounter (Signed)
My Chart message sent to pt. Did not receive records.

## 2019-03-21 ENCOUNTER — Telehealth: Payer: Self-pay

## 2019-03-21 NOTE — Telephone Encounter (Signed)
Copied from [place] [id number]. Topic: General - Inquiry >> Mar 21, 2019 11:03 AM Andrea Berg wrote: Reason for CRM: Patient has questions regarding her medical records that EP was suppose to send over. Please advise  Call back [phone]

## 2019-03-21 NOTE — Telephone Encounter (Signed)
Called patent reviewed all information and answered all questions. Let her know that we have sent records to scan and it will be several days before it is in her records. Also informed that she would not be able to see those records on my chart. Our system does not support uploading outside notes for patient review.

## 2019-04-13 ENCOUNTER — Encounter: Payer: Commercial Managed Care - PPO | Admitting: Family Medicine

## 2019-04-26 ENCOUNTER — Encounter: Payer: Commercial Managed Care - PPO | Admitting: Family Medicine

## 2019-04-26 DIAGNOSIS — Z0289 Encounter for other administrative examinations: Secondary | ICD-10-CM

## 2019-04-29 ENCOUNTER — Encounter: Payer: Self-pay | Admitting: Physician Assistant

## 2019-06-13 ENCOUNTER — Encounter: Payer: Commercial Managed Care - PPO | Admitting: Family Medicine

## 2019-07-18 ENCOUNTER — Encounter: Payer: Commercial Managed Care - PPO | Admitting: Family Medicine

## 2019-08-10 ENCOUNTER — Ambulatory Visit (INDEPENDENT_AMBULATORY_CARE_PROVIDER_SITE_OTHER): Payer: Commercial Managed Care - PPO | Admitting: Family Medicine

## 2019-08-10 ENCOUNTER — Encounter: Payer: Self-pay | Admitting: Family Medicine

## 2019-08-10 ENCOUNTER — Other Ambulatory Visit: Payer: Self-pay

## 2019-08-10 VITALS — BP 112/64 | HR 14 | Temp 97.4°F | Resp 14 | Ht 65.0 in | Wt 140.8 lb

## 2019-08-10 DIAGNOSIS — N920 Excessive and frequent menstruation with regular cycle: Secondary | ICD-10-CM

## 2019-08-10 DIAGNOSIS — K2100 Gastro-esophageal reflux disease with esophagitis, without bleeding: Secondary | ICD-10-CM | POA: Diagnosis not present

## 2019-08-10 DIAGNOSIS — K8050 Calculus of bile duct without cholangitis or cholecystitis without obstruction: Secondary | ICD-10-CM

## 2019-08-10 DIAGNOSIS — K838 Other specified diseases of biliary tract: Secondary | ICD-10-CM

## 2019-08-10 DIAGNOSIS — K2270 Barrett's esophagus without dysplasia: Secondary | ICD-10-CM

## 2019-08-10 DIAGNOSIS — K146 Glossodynia: Secondary | ICD-10-CM | POA: Diagnosis not present

## 2019-08-10 MED ORDER — FAMOTIDINE 20 MG PO TABS: 40.0000 mg | ORAL_TABLET | Freq: Every day | ORAL | Status: DC

## 2019-08-10 MED ORDER — CLONAZEPAM 0.25 MG PO TBDP: 0.2500 mg | ORAL_TABLET | Freq: Two times a day (BID) | ORAL | 2 refills | Status: DC

## 2019-08-10 NOTE — Progress Notes (Signed)
Subjective  CC:  Chief Complaint  Patient presents with  . Transitions Of Care    last CPE 10/29/18  . Abdominal Pain    bile duct dilation Dec 2020, still experiencing abdominal pain and burning on her tongue  . Metrorrhagia    started in March 2021    HPI: Andrea Berg is a 49 y.o. female who presents to [facility] [place] today to establish care with me as a new patient.   She has the following concerns or needs:  I reviewed multiple records from GI, Gen surgery and prior pcp.   Has been a difficult year for her: ultimately, has biliary sludge and biliary colic that has improved after ERCP. She can consider a lap chole if sxs recur more frequently. Current sxs are intermittent and mild.   Atypical GERD on high dose PPI now. Upper endoscopy revealed barettes esophagus and gastritis.   49 yo G2P2, condoms for birth control due to intolerance to OCPs years ago with increased frequency of menses over the last 2 months w/o other sxs: no pain, hot flushes, vaginal discharge. H/o mild anemia when last checked a year ago  Burning mouth: 1 year of burning on tongue; starts after brushes teeth in the morning and present all day. No oral lesions, infections, dry mouth, new medications. Neg dentist evaluation. Reviewed nl b12 and tsh last year. Very bothersome to patient. Magic mouthwash w/o relief.   Assessment  1. Burning mouth syndrome   2. Gastroesophageal reflux disease with esophagitis without hemorrhage   3. Barrett's esophagus without dysplasia   4. Biliary sludge   5. Biliary colic symptom   6. Polymenorrhea      Plan   Burning mouth syndrome, idiopathic: trial of klonopin wafers/ODT bid. Could also consider neurontin or pramipexole. Reassured. On high dose PPI now as well for atypical gerd  Biliary colic: improving. Has f/u with GI and Gen surg if needed. Will recheck labs at cpe  Perimenopausal polymenorrhea: reassured. Will monitor. If persists,  check labs, pelvic ultrasound and consider GYN referral for mgt or IUD   Barett's: will need surveillance with GI.   Follow up:  3-4 weeks for recheck No orders of the defined types were placed in this encounter.  Meds ordered this encounter  Medications  . clonazePAM (KLONOPIN) 0.25 MG disintegrating tablet    Sig: Take 1-2 tablets (0.25-0.5 mg total) by mouth 2 (two) times daily.    Dispense:  60 tablet    Refill:  2  . famotidine (PEPCID) 20 MG tablet    Sig: Take 2 tablets (40 mg total) by mouth daily.     Depression screen [facility] 2/9 10/29/2018 12/28/2017 09/23/2016  Decreased Interest 0 0 0  Down, Depressed, Hopeless 0 0 0  PHQ - 2 Score 0 0 0  Altered sleeping 3 - -  Tired, decreased energy 0 - -  Change in appetite 0 - -  Feeling bad or failure about yourself  0 - -  Trouble concentrating 0 - -  Moving slowly or fidgety/restless 0 - -  Suicidal thoughts 0 - -  PHQ-9 Score 3 - -  Difficult doing work/chores Not difficult at all - -    We updated and reviewed the patient's past history in detail and it is documented below.  Patient Active Problem List   Diagnosis Date Noted  . 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 Barrett's esophagus without dysplasia 08/10/2019  . Biliary colic symptom 30/16/0109  .  Biliary sludge 08/10/2019  . Gastroesophageal reflux disease with esophagitis without hemorrhage 08/10/2019  . Localization-related (focal) (partial) epilepsy and epileptic syndromes with complex partial seizures, with intractable epilepsy 10/19/2013  . Other specified congenital anomalies of brain 10/19/2013   Health Maintenance  Topic Date Due  . INFLUENZA VACCINE  10/30/2019  . PAP SMEAR-Modifier  12/28/2020  . TETANUS/TDAP  02/06/2026  . HIV Screening  Completed   Immunization History  Administered Date(s) Administered  . Influenza,inj,Quad PF,6+ Mos 12/28/2017  . Influenza-Unspecified 04/20/2014  . PPD Test 02/14/2015  . Td 01/10/2016, 02/07/2016  . Tdap  06/18/2010   Current Meds  Medication Sig  . acetaminophen (TYLENOL) 325 MG tablet Take 650 mg by mouth every 6 (six) hours as needed.  . famotidine (PEPCID) 20 MG tablet Take 2 tablets (40 mg total) by mouth daily.  Marland Kitchen ibuprofen (ADVIL,MOTRIN) 100 MG tablet Take 100 mg by mouth every 6 (six) hours as needed for fever.  . levETIRAcetam (KEPPRA) 500 MG tablet Take 1 tablet (500 mg total) by mouth 2 (two) times daily.  . Prenatal Vit-Fe Fumarate-FA (PRENATAL MULTIVITAMIN) TABS tablet Take 1 tablet by mouth daily at 12 noon.  . [DISCONTINUED] famotidine (PEPCID) 20 MG tablet TAKE 1 TABLET BY MOUTH TWICE A DAY (Patient taking differently: 40 mg. )    Allergies: Patient is allergic to levaquin [levofloxacin] and nexium [esomeprazole]. Past Medical History Patient  has a past medical history of Fatigue, Hyperactive, Lower half migraine, Palpitations, and Seizures (HCC). Past Surgical History Patient  has no past surgical history on file. Family History: Patient family history includes Heart attack in her father. Social History:  Patient  reports that she has never smoked. She has never used smokeless tobacco. She reports that she does not drink alcohol or use drugs.  Review of Systems: Constitutional: negative for fever or malaise Ophthalmic: negative for photophobia, double vision or loss of vision Cardiovascular: negative for chest pain, dyspnea on exertion, or new LE swelling Respiratory: negative for SOB or persistent cough Gastrointestinal: + for abdominal pain, Neg change in bowel habits or melena Genitourinary: negative for dysuria or gross hematuria Musculoskeletal: negative for new gait disturbance or muscular weakness Integumentary: negative for new or persistent rashes Neurological: negative for TIA or stroke symptoms Psychiatric: negative for SI or delusions Allergic/Immunologic: negative for hives  Patient Care Team    Relationship Specialty Notifications Start End  Willow Ora, MD PCP - General Family Medicine  08/10/19   Carla Drape Consulting Physician Gastroenterology  08/10/19   Mamie Laurel, MD Referring Physician General Surgery  08/10/19     Objective  Vitals: BP 112/64   Pulse (!) 14   Temp (!) 97.4 F (36.3 C) (Temporal)   Resp 14   Ht 5\' 5"  (1.651 m)   Wt 140 lb 12.8 oz (63.9 kg)   SpO2 99%   BMI 23.43 kg/m  General:  Well developed, well nourished, no acute distress  Psych:  Alert and oriented,normal mood and affect HEENT:  Normocephalic, atraumatic, non-icteric sclera,    Commons side effects, risks, benefits, and alternatives for medications and treatment plan prescribed today were discussed, and the patient expressed understanding of the given instructions. Patient is instructed to call or message via MyChart if he/she has any questions or concerns regarding our treatment plan. No barriers to understanding were identified. We discussed Red Flag symptoms and signs in detail. Patient expressed understanding regarding what to do in case of urgent or emergency type symptoms.  Medication list was reconciled, printed and provided to the patient in AVS. Patient instructions and summary information was reviewed with the patient as documented in the AVS. This note was prepared with assistance of Dragon voice recognition software. Occasional wrong-word or sound-a-like substitutions may have occurred due to the inherent limitations of voice recognition software  This visit occurred during the SARS-CoV-2 public health emergency.  Safety protocols were in place, including screening questions prior to the visit, additional usage of staff PPE, and extensive cleaning of exam room while observing appropriate contact time as indicated for disinfecting solutions.

## 2019-08-10 NOTE — Patient Instructions (Signed)
Please return in 3-4 weeks for recheck.   Monitor your menstrual cycles.  Start iron daily and a B complex vitamin to help your burning mouth syndrome.  Start the clonazepam twice daily to see if that helps as well.   It was a pleasure meeting you today! Thank you for choosing [place] to meet your healthcare needs! I truly look forward to working with you. If you have any questions or concerns, please send me a message via Mychart or call the office at [phone].

## 2019-08-30 ENCOUNTER — Telehealth: Payer: Self-pay | Admitting: Family Medicine

## 2019-08-30 NOTE — Telephone Encounter (Signed)
Please schedule patient for an appt, any virtual slot or day that has openings

## 2019-08-30 NOTE — Telephone Encounter (Signed)
Patient states that she will wait until her visit on 6/21.  States she wants to see how her symptoms are for a few days not taking the clonazepam.  States if she feels she needs to schedule a virtual visit before she will give the office a call.

## 2019-08-30 NOTE — Telephone Encounter (Signed)
Patient called in and wanted to let Dr. Mardelle Matte know that the clonazePAM (KLONOPIN) 0.25 MG disintegrating tablet is not helping with the burning in her mouth. Patient stated it makes her very drowsy so she stopped taking it. Patient would like to know if there is anything else she could do to help it.

## 2019-09-19 ENCOUNTER — Ambulatory Visit: Payer: Commercial Managed Care - PPO | Admitting: Family Medicine

## 2019-09-19 ENCOUNTER — Ambulatory Visit: Payer: Commercial Managed Care - PPO | Admitting: Physician Assistant

## 2019-09-20 ENCOUNTER — Ambulatory Visit (INDEPENDENT_AMBULATORY_CARE_PROVIDER_SITE_OTHER): Payer: Commercial Managed Care - PPO | Admitting: Family Medicine

## 2019-09-20 ENCOUNTER — Other Ambulatory Visit: Payer: Self-pay

## 2019-09-20 ENCOUNTER — Encounter: Payer: Self-pay | Admitting: Family Medicine

## 2019-09-20 VITALS — BP 108/62 | HR 73 | Temp 98.1°F | Resp 12 | Ht 65.0 in | Wt 137.0 lb

## 2019-09-20 DIAGNOSIS — M7062 Trochanteric bursitis, left hip: Secondary | ICD-10-CM | POA: Diagnosis not present

## 2019-09-20 MED ORDER — DICLOFENAC SODIUM 75 MG PO TBEC: 75.0000 mg | DELAYED_RELEASE_TABLET | Freq: Two times a day (BID) | ORAL | 0 refills | Status: DC

## 2019-09-20 NOTE — Patient Instructions (Addendum)
Please return in 3-6 months for your annual complete physical; please come fasting.  Please ice the sore hip twice a day. Take the medication twice a day with food and practice the stretching exercises from the handout.   If you have any questions or concerns, please don't hesitate to send me a message via MyChart or call the office at [phone]. Thank you for visiting with Andrea Berg today! It's our pleasure caring for you.   Hip Bursitis  Hip bursitis is inflammation of a fluid-filled sac (bursa) in the hip joint. The bursa prevents the bones in the hip joint from rubbing against each other. Hip bursitis can cause mild to moderate pain, and symptoms often come and go over time. What are the causes? This condition may be caused by:  Injury to the hip.  Overuse of the muscles that surround the hip joint.  Previous injury or surgery of the hip.  Arthritis or gout.  Diabetes.  Thyroid disease.  Infection. In some cases, the cause may not be known. What are the signs or symptoms? Symptoms of this condition include:  Mild or moderate pain in the hip area. Pain may get worse with movement.  Tenderness and swelling of the hip, especially on the outer side of the hip.  In rare cases, the bursa may become infected. This may cause a fever, as well as warmth and redness in the area. Symptoms may come and go. How is this diagnosed? This condition may be diagnosed based on:  A physical exam.  Your medical history.  X-rays.  Removal of fluid from your inflamed bursa for testing (biopsy). You may be sent to a health care provider who specializes in bone diseases (orthopedist) or a provider who specializes in joint inflammation (rheumatologist). How is this treated? This condition is treated by resting, icing, applying pressure (compression), and raising (elevating) the injured area. This is called RICE treatment. In some cases, this may be enough to make your symptoms go away. Treatment  may also include:  Using crutches.  Draining fluid out of the bursa to help relieve swelling.  Injecting medicine that helps to reduce inflammation (cortisone).  Additional medicines if the bursa is infected. Follow these instructions at home: Managing pain, stiffness, and swelling   If directed, put ice on the painful area. ? Put ice in a plastic bag. ? Place a towel between your skin and the bag. ? Leave the ice on for 20 minutes, 2-3 times a day. ? Raise (elevate) your hip above the level of your heart as much as you can without pain. To do this, try putting a pillow under your hips while you lie down. Activity  Return to your normal activities as told by your health care provider. Ask your health care provider what activities are safe for you.  Rest and protect your hip as much as possible until your pain and swelling get better. General instructions  Take over-the-counter and prescription medicines only as told by your health care provider.  Wear compression wraps only as told by your health care provider.  Do not use your hip to support your body weight until your health care provider says that you can. Use crutches as told by your health care provider.  Gently massage and stretch your injured area as often as is comfortable.  Keep all follow-up visits as told by your health care provider. This is important. How is this prevented?  Exercise regularly, as told by your health care provider.  Warm  up and stretch before being active.  Cool down and stretch after being active.  If an activity irritates your hip or causes pain, avoid the activity as much as possible.  Avoid sitting down for long periods at a time. Contact a health care provider if you:  Have a fever.  Develop new symptoms.  Have difficulty walking or doing everyday activities.  Have pain that gets worse or does not get better with medicine.  Develop red skin or a feeling of warmth in your hip  area. Get help right away if you:  Cannot move your hip.  Have severe pain. Summary  Hip bursitis is inflammation of a fluid-filled sac (bursa) in the hip joint.  Hip bursitis can cause mild to moderate pain, and symptoms often come and go over time.  This condition is treated with rest, ice, compression, elevation, and medicines. This information is not intended to replace advice given to you by your health care provider. Make sure you discuss any questions you have with your health care provider. Document Revised: 11/23/2017 Document Reviewed: 11/23/2017 Elsevier Patient Education  2020 [employer].

## 2019-09-20 NOTE — Progress Notes (Signed)
Subjective  CC:  Chief Complaint  Patient presents with   Leg Pain    lt leg to knee. tender to touch. x 3 mo    Follow-up    from GI     HPI: Andrea Berg is a 49 y.o. female who presents to the office today to address the problems listed above in the chief complaint.  3 months of pain on left side of hip; hurts to lie on that side. Pain now radiates down lateral thigh. Feels better with stretching. No low back pain. No weakness in leg. No injury. No overuse. No b/b dysfunction.  Of note, burning mouth is better.  Still with GI pain and has f/u with GI in august.    Assessment  1. Greater trochanteric bursitis of left hip      Plan   Hip bursitis:  Left, education. nsaids w/ food x 2 weeks, stretching exercises from sports advisor given and icing. Pt would like to avoid steroid injections.  F/u with gi.  Follow up: Return in about 3 months (around 12/21/2019) for complete physical.  12/16/2019  No orders of the defined types were placed in this encounter.  Meds ordered this encounter  Medications   diclofenac (VOLTAREN) 75 MG EC tablet    Sig: Take 1 tablet (75 mg total) by mouth 2 (two) times daily with a meal.    Dispense:  30 tablet    Refill:  0      I reviewed the patients updated PMH, FH, and SocHx.   ddf Patient Active Problem List   Diagnosis Date Noted   Burning mouth syndrome 08/10/2019   Barrett's esophagus without dysplasia 40/98/1191   Biliary colic symptom 47/82/9562   Biliary sludge 08/10/2019   Gastroesophageal reflux disease with esophagitis without hemorrhage 08/10/2019   Localization-related (focal) (partial) epilepsy and epileptic syndromes with complex partial seizures, with intractable epilepsy 10/19/2013   Other specified congenital anomalies of brain 10/19/2013   Current Meds  Medication Sig   acetaminophen (TYLENOL) 325 MG tablet Take 650 mg by mouth every 6 (six) hours as needed.   famotidine (PEPCID) 20 MG tablet Take 2  tablets (40 mg total) by mouth daily.   ibuprofen (ADVIL,MOTRIN) 100 MG tablet Take 100 mg by mouth every 6 (six) hours as needed for fever.   levETIRAcetam (KEPPRA) 500 MG tablet Take 1 tablet (500 mg total) by mouth 2 (two) times daily.   Prenatal Vit-Fe Fumarate-FA (PRENATAL MULTIVITAMIN) TABS tablet Take 1 tablet by mouth daily at 12 noon.    Allergies: Patient is allergic to levaquin [levofloxacin] and nexium [esomeprazole]. Family History: Patient family history includes Heart attack in her father. Social History:  Patient  reports that she has never smoked. She has never used smokeless tobacco. She reports that she does not drink alcohol and does not use drugs.  Review of Systems: Constitutional: Negative for fever malaise or anorexia Cardiovascular: negative for chest pain Respiratory: negative for SOB or persistent cough Gastrointestinal: negative for abdominal pain  Objective  Vitals: BP 108/62    Pulse 73    Temp 98.1 F (36.7 C) (Temporal)    Resp 12    Ht 5\' 5"  (1.651 m)    Wt 137 lb (62.1 kg)    SpO2 99%    BMI 22.80 kg/m  General: no acute distress , A&Ox3 Back: no ttp. FROM. Nl gait ttp over left gr trochanteric bursa     Commons side effects, risks, benefits, and alternatives for medications  and treatment plan prescribed today were discussed, and the patient expressed understanding of the given instructions. Patient is instructed to call or message via MyChart if he/she has any questions or concerns regarding our treatment plan. No barriers to understanding were identified. We discussed Red Flag symptoms and signs in detail. Patient expressed understanding regarding what to do in case of urgent or emergency type symptoms.   Medication list was reconciled, printed and provided to the patient in AVS. Patient instructions and summary information was reviewed with the patient as documented in the AVS. This note was prepared with assistance of Dragon voice recognition  software. Occasional wrong-word or sound-a-like substitutions may have occurred due to the inherent limitations of voice recognition software  This visit occurred during the SARS-CoV-2 public health emergency.  Safety protocols were in place, including screening questions prior to the visit, additional usage of staff PPE, and extensive cleaning of exam room while observing appropriate contact time as indicated for disinfecting solutions.

## 2019-10-19 ENCOUNTER — Telehealth: Payer: Self-pay | Admitting: Family Medicine

## 2019-10-19 NOTE — Telephone Encounter (Signed)
Email was sent to charge correction to waive fee as a one-time courtesy for pt. Pt was called and made aware. Pt demographic information was confirmed and made sure pt new how to use MyChart and received notifications.

## 2019-10-19 NOTE — Telephone Encounter (Signed)
Pt called stating she previously received a bill for being no showed for her appointment on 04/26/2019. Pt states she has spoken with billing and the office and was told the charge would be forgiven. Pt states her bill has now been sent to collections and would like to speak with someone about it. I do not see any notes in the chart about this issue. Pt did state she was called on 04/25/2019 about scheduling the appointment for 04/26/2019 and did agree that she said she would be able to make the appt. Pt stated she did not receive a reminder text message from [place] about the appointment and that she relies heavily on those. Please advise.

## 2019-11-07 ENCOUNTER — Encounter: Payer: Self-pay | Admitting: Neurology

## 2019-11-07 ENCOUNTER — Ambulatory Visit (INDEPENDENT_AMBULATORY_CARE_PROVIDER_SITE_OTHER): Payer: Commercial Managed Care - PPO | Admitting: Neurology

## 2019-11-07 VITALS — BP 92/62 | HR 71 | Ht 64.0 in | Wt 137.0 lb

## 2019-11-07 DIAGNOSIS — G40209 Localization-related (focal) (partial) symptomatic epilepsy and epileptic syndromes with complex partial seizures, not intractable, without status epilepticus: Secondary | ICD-10-CM | POA: Diagnosis not present

## 2019-11-07 MED ORDER — LEVETIRACETAM 500 MG PO TABS: ORAL_TABLET | ORAL | 3 refills | Status: DC

## 2019-11-07 NOTE — Progress Notes (Signed)
[facility] [address] [place] [place] [address] [phone]       OFFICE FOLLOW UP VISIT NOTE  [name] Date of Birth:  07/07/1970 Medical Record Number:  [id number]   Referring MD:  London Pepper  Reason for Referral:  seizures  HPI: 45 year [place] lady who recently returned from [place] where she had  hospitalization for a repetitive seizures and abnormal MRI scan of the brain. She is accompanied today by husband and has brought with her extensive hospital records, several MRI scans and lab results which I have  personally reviewed. She started feeling sick   actually the first week of June 2015 several days prior to going to [place]. She was felt to have a sinus infection and treated with Augmentin for sinusitis and Zofran for nausea. She felt tired after this and the day prior to her trip her husband noted that she looked pale and had head was turned to one side and she'll not fully responsive. EMS were called and patient vomited. She was taken to the long emergency room where she was felt to have near syncope and dehydration. She was treated with IV fluids. CT scan of the head, x-rays EKG and lab work were unremarkable. She was discharged on and felt better next morning after taking Sudafed. She traveled to [place] up with felt unwell. She started hearing voices in her head which were talking to each other. She is more able to concentrate and spent time with relatives. She felt better after she rested. Several days later after she had stayed up late in the night the husband noticed that she will have a generalized clonic tonic seizure. She was noted as morning with tossing and turning with teeth clenching. She was taken to the  [facility] hospital where she was diagnosed having seizures and treated with diazepam. She fell asleep. Next-day she was asked to see a neurologist who advised an MRI scan and EEG. She started having speech difficulties and  stopping  midsentence   and unable to complete sentences. The patient's husband [name] L4 medial which have looked at in which patient is making vocalizations but not able to form words. Patient was subsequently hospitalized and underwent a spinal tap which was reportedly normal and an MRI scan of the brain which showed weak diffusion positive lesion in the left medial frontal lobe cingulate gyrus. There were no ADC map images.This lesion was also seen on T2 and flair images and   a postcontrast MRI showed a venous angioma adjacent to this lesion. Lesion was felt to represent cortical dysplasia versus postictal changes from repititive seizures.. The patient was initially started on Trileptal and phenytoin but continued to have auditory hallucinations and some speech and word finding difficulties but eventually she was switched to [place] and she had a miraculous response to this and voices in her head stopped and her speech improved and she's not had any further seizures since then . She however has had some paranoid ideation and is having thoughts of fears of separation from her family and thoughts like ironing clothes we'll start a fire. She is currently taking Keppra 500 twice daily and Trileptal 300 twice daily. She had lab work done on 10/18/13 which showed low sodium of 133 but normal WBC and electrolytes and liver functions otherwise. Update 12/30/2013 : She returns for followup after last visit 3 months ago. She is doing well and has not had any recurrent seizures. She has tapered and  discontinued Trileptal. She is currently on Keppra 500 twice daily. She does complain of some excessive sleepiness and tiredness. She is continues to have mild short-term memory and cognitive difficulties but some of these 3 seated her seizures. Husband is concerned that at times she forgets recent conversations and events. She had EEG done which I have personally reviewed and was normal. A followup MRI scan of the brain done on 12/08/13  personally reviewed shows left frontal small venous angioma without evidence of significant hemorrhage. The left cingulate gyrus swelling and cytotoxic edema have completely resolved compared with previous MRI from June 2015 from [place] Update 08/11/2014 : She returns for follow-up after last visit 7 months ago. She continues to do well without any recurrent seizures. She has been quite compliant with Keppra which she is taking regularly. She is tolerating it well without significant side effects. She does complain of slight fatigue as well as palpitations but this may be related to underlying anxiety and some element of posttraumatic stress. She has not been participating in any regular activities for stress relaxation. Update 02/21/2015 : She returns for follow-up after last visit 6 months ago. She is accompanied by husband. She continues to do well without recurrent seizures. She has been compliant with Keppra which she is taking regularly and tolerating it quite well. She continues to have slight fatigue as well as anxiety. She has not been participating in regular activities for stress laxation. She has questions about how long she needs to stay on Keppra and I spent a lot of time counseling patient and her husband the need for long-term anticonvulsants given the fact she had multiple seizures Update 08/20/2015 : She returns for follow-up after last visit 6 months ago. She is accompanied by her husband. She is doing well and has not had recurrent seizures now for nearly 2 years. The she is tolerating Keppra 500 mg twice daily quite well without any side effects. Did have a transient episode of pain in the left hand and does have occasional knee and joint pains but otherwise is doing quite well. She wants to lose weight but has not been successful. She has no new complaints today. She and her husband again had questions about the need for and duration of long-term anti-convulsant therapy. Update 09/02/2016 ; She  returns for follow-up after last visit a year ago. She continues to do well without recurrent seizures now for 3 years. She is starting Keppra 500 mg twice daily without any side effects. She has recently started working as a Lawyer. She is very careful and regular with her sleeping and eating habits and avoids driving at night. She has no new neurological complaints today. She has had no new health problems since last 1 year. Update 11/12/2017 : She returns for follow-up after last visit a year ago. She continues to do well and hasn't not had recurrent seizures for greater than 3 years. She is tolerating Keppra 500 mg twice daily without any side effects. She has no neurological complaints no new health problems since last year. She plans to see her primary care physician for new complaints of knee pain which are not quite bothersome. Update 11/22/2018 : She returns for follow-up after last visit a year ago.  She is doing well and has not had recurrent seizures now for 4 years.  She remains on Keppra 500 mg twice daily which is tolerating well without any side effects.  She has no neurological complaints today.  She has  been having some chronic right-sided abdominal pain and saw her primary physician who has recently referred her to gastroenterologist that appointment has not yet happened.  She is working from home due to the coronavirus pandemic.   Update 11/07/2019 : She returns for follow-up after last visit a year ago.  She continues to do well and has not had any breakthrough seizures or aura now for greater than 5 years.  She remains on Keppra 500 mg twice daily which she seems to be tolerating well.  She is still working from home due to [employer].  She has had no new neurological or other health related issues.  She has no complaints today.  She states she has trouble abducting to change and has trouble sleeping if she is she is not sleeping at the same time in her own home recently she returned from  a long road trip felt her sleep was difficult.  No other complaints ROS:   14 system review of systems is positive for no complaints today and all other systems negative. PMH:  Past Medical History:  Diagnosis Date  . Fatigue   . Hyperactive   . Lower half migraine   . Palpitations   . Seizures (HCC)     Social History:  Social History   Socioeconomic History  . Marital status: Married    Spouse name: Not on file  . Number of children: 2  . Years of education: degree  . Highest education level: Not on file  Occupational History  . Occupation: school    Employer: UNEMPLOYED  Tobacco Use  . Smoking status: Never Smoker  . Smokeless tobacco: Never Used  Substance and Sexual Activity  . Alcohol use: No  . Drug use: No  . Sexual activity: Yes  Other Topics Concern  . Not on file  Social History Narrative   Patient lives with her husband and 2 children   Patient is right handed    Patient drinks tea   Social Determinants of Health   Financial Resource Strain:   . Difficulty of Paying Living Expenses:   Food Insecurity:   . Worried About Programme researcher, broadcasting/film/video in the Last Year:   . Barista in the Last Year:   Transportation Needs:   . Freight forwarder (Medical):   Marland Kitchen Lack of Transportation (Non-Medical):   Physical Activity:   . Days of Exercise per Week:   . Minutes of Exercise per Session:   Stress:   . Feeling of Stress :   Social Connections:   . Frequency of Communication with Friends and Family:   . Frequency of Social Gatherings with Friends and Family:   . Attends Religious Services:   . Active Member of Clubs or Organizations:   . Attends Banker Meetings:   Marland Kitchen Marital Status:   Intimate Partner Violence:   . Fear of Current or Ex-Partner:   . Emotionally Abused:   Marland Kitchen Physically Abused:   . Sexually Abused:     Medications:   Current Outpatient Medications on File Prior to Visit  Medication Sig Dispense Refill  .  acetaminophen (TYLENOL) 325 MG tablet Take 650 mg by mouth every 6 (six) hours as needed.    . famotidine (PEPCID) 20 MG tablet Take 2 tablets (40 mg total) by mouth daily.    Marland Kitchen ibuprofen (ADVIL,MOTRIN) 100 MG tablet Take 100 mg by mouth every 6 (six) hours as needed for fever.    . Prenatal Vit-Fe  Fumarate-FA (PRENATAL MULTIVITAMIN) TABS tablet Take 1 tablet by mouth daily at 12 noon.    . clonazePAM (KLONOPIN) 0.25 MG disintegrating tablet Take 1-2 tablets (0.25-0.5 mg total) by mouth 2 (two) times daily. (Patient not taking: Reported on 09/20/2019) 60 tablet 2  . diclofenac (VOLTAREN) 75 MG EC tablet Take 1 tablet (75 mg total) by mouth 2 (two) times daily with a meal. (Patient not taking: Reported on 11/07/2019) 30 tablet 0   No current facility-administered medications on file prior to visit.    Allergies:   Allergies  Allergen Reactions  . Levaquin [Levofloxacin] Nausea And Vomiting  . Nexium [Esomeprazole] Nausea And Vomiting    Dizziness.     Physical Exam General: well developed, well nourished young  [place] lady, seated, in no evident distress Head: head normocephalic and atraumatic.   Neck: supple with no carotid or supraclavicular bruits Cardiovascular: regular rate and rhythm, no murmurs Musculoskeletal: no deformity Skin:  no rash/petichiae Vascular:  Normal pulses all extremities Vitals:   11/07/19 1019  BP: 92/62  Pulse: 71    Neurologic Exam Mental Status: Awake and fully alert. Oriented to place and time. Recent and remote memory intact. . Attention span, concentration and fund of knowledge appropriate. Mood and affect appropriate. No paranoid ideation noted. Mini-Mental Status exam not done. Cranial Nerves: Fundoscopic exam not done . Pupils equal, briskly reactive to light. Extraocular movements full without nystagmus. Visual fields full to confrontation. Hearing intact. Facial sensation intact. Face, tongue, palate moves normally and symmetrically.    Motor: Normal bulk and tone. Normal strength in all tested extremity muscles. Sensory.: intact to touch and pinprick and vibratory sensation.  Coordination: Rapid alternating movements normal in all extremities. Finger-to-nose and heel-to-shin performed accurately bilaterally. Gait and Station: Arises from chair without difficulty. Stance is normal. Gait demonstrates normal stride length and balance . Able to heel, toe and tandem walk with slight difficulty.  Reflexes: 1+ and symmetric. Toes downgoing.      ASSESSMENT: 88 year [place] lady with recent episodes of complex partial, simple partial and partial onset seizures with secondary generalization in June 2015 symptomatic from left frontal lesion etiology indeterminate. Adjacent venous angioma with  post ictal MRI changes from repetitive seizures. Seizures seem well controlled    PLAN: I had a long discussion with the patient regarding her remote complex partial seizures which seem to be well controlled on the current medication regimen of Keppra 500 mg twice daily and she has been seizure-free from more than 5 years now.  Recommend we try reducing the dose of Keppra to 250 mg in the morning and 500 mg at night.  She was advised to avoid seizure provoking stimuli like sleep deprivation.  She will return for follow-up in the future in a year or call earlier if necessary.. Greater than 50% time during this 25 minute visit was spent on counseling and coordination of care about seizures and prevention of recurrence. Delia Heady, MD    Note: This document was prepared with digital dictation and possible smart phrase technology. Any transcriptional errors that result from this process are unintentional.

## 2019-11-07 NOTE — Patient Instructions (Signed)
I had a long discussion with the patient regarding her remote complex partial seizures which seem to be well controlled on the current medication regimen of Keppra 500 mg twice daily and she has been seizure-free from more than 5 years now.  Recommend we try reducing the dose of Keppra to 250 mg in the morning and 500 mg at night.  She was advised to avoid seizure provoking stimuli like sleep deprivation.  She will return for follow-up in the future in a year or call earlier if necessary.

## 2019-12-16 ENCOUNTER — Encounter: Payer: Self-pay | Admitting: Family Medicine

## 2019-12-16 ENCOUNTER — Ambulatory Visit (INDEPENDENT_AMBULATORY_CARE_PROVIDER_SITE_OTHER): Payer: Commercial Managed Care - PPO | Admitting: Family Medicine

## 2019-12-16 ENCOUNTER — Other Ambulatory Visit: Payer: Self-pay

## 2019-12-16 VITALS — BP 114/68 | HR 74 | Temp 98.1°F | Ht 64.0 in | Wt 137.0 lb

## 2019-12-16 DIAGNOSIS — Z1159 Encounter for screening for other viral diseases: Secondary | ICD-10-CM

## 2019-12-16 DIAGNOSIS — Z0000 Encounter for general adult medical examination without abnormal findings: Secondary | ICD-10-CM

## 2019-12-16 DIAGNOSIS — K2100 Gastro-esophageal reflux disease with esophagitis, without bleeding: Secondary | ICD-10-CM

## 2019-12-16 DIAGNOSIS — K2270 Barrett's esophagus without dysplasia: Secondary | ICD-10-CM | POA: Diagnosis not present

## 2019-12-16 DIAGNOSIS — Z23 Encounter for immunization: Secondary | ICD-10-CM

## 2019-12-16 DIAGNOSIS — G40009 Localization-related (focal) (partial) idiopathic epilepsy and epileptic syndromes with seizures of localized onset, not intractable, without status epilepticus: Secondary | ICD-10-CM | POA: Diagnosis not present

## 2019-12-16 DIAGNOSIS — Z1231 Encounter for screening mammogram for malignant neoplasm of breast: Secondary | ICD-10-CM

## 2019-12-16 NOTE — Progress Notes (Signed)
Subjective  Chief Complaint  Patient presents with  . Annual Exam    fasting  . Alopecia    believes this is a side effect of her current medication regimen     HPI: Andrea Berg is a 49 y.o. female who presents to [facility] today for a Female Wellness Visit. She also has the concerns and/or needs as listed above in the chief complaint. These will be addressed in addition to the Health Maintenance Visit.   Wellness Visit: annual visit with health maintenance review and exam without Pap   HM: due mammo; due flu and hep c screen. Eats well.  Chronic disease f/u and/or acute problem visit: (deemed necessary to be done in addition to the wellness visit):  C/o thinning hair without rash over last several months. No bald patches. Uses some hair color. Changed shampoo. New problem  GERD/baretts: per GI; I reviewed notes.   Seizures are stable   Assessment  1. Annual physical exam   2. Barrett's esophagus without dysplasia   3. Partial idiopathic epilepsy with seizures of localized onset, not intractable, without status epilepticus (HCC)   4. Gastroesophageal reflux disease with esophagitis without hemorrhage      Plan  Female Wellness Visit:  Age appropriate Health Maintenance and Prevention measures were discussed with patient. Included topics are cancer screening recommendations, ways to keep healthy (see AVS) including dietary and exercise recommendations, regular eye and dental care, use of seat belts, and avoidance of moderate alcohol use and tobacco use.   BMI: discussed patient's BMI and encouraged positive lifestyle modifications to help get to or maintain a target BMI.  HM needs and immunizations were addressed and ordered. See below for orders. See HM and immunization section for updates.  Routine labs and screening tests ordered including cmp, cbc and lipids where appropriate.  Discussed recommendations regarding Vit D and calcium  supplementation (see AVS)  Chronic disease management visit and/or acute problem visit:  GERD and burning pain: increased PPI dose and monitoring. See GI notes  Seizures are stable  Hair thinning: suspect telogen effluvium or perimenpausal. Reassured. Check thyroid.    Follow up: Return in about 1 year (around 12/15/2020) for complete physical.  Orders Placed This Encounter  Procedures  . COMPLETE METABOLIC PANEL WITH GFR  . CBC with Differential/Platelet  . Lipid panel  . TSH   No orders of the defined types were placed in this encounter.     Lifestyle: Body mass index is 23.52 kg/m. Wt Readings from Last 3 Encounters:  12/16/19 137 lb (62.1 kg)  11/07/19 137 lb (62.1 kg)  09/20/19 137 lb (62.1 kg)     Patient Active Problem List   Diagnosis Date Noted  . 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 Barrett's esophagus without dysplasia 08/10/2019  . Biliary colic symptom 43/32/9518  . Biliary sludge 08/10/2019  . Gastroesophageal reflux disease with esophagitis without hemorrhage 08/10/2019  . Epilepsy (HCC) 10/19/2013  . Other specified congenital anomalies of brain 10/19/2013   Health Maintenance  Topic Date Due  . Hepatitis C Screening  Never done  . INFLUENZA VACCINE  10/30/2019  . PAP SMEAR-Modifier  12/28/2020  . TETANUS/TDAP  02/06/2026  . COVID-19 Vaccine  Completed  . HIV Screening  Completed   Immunization History  Administered Date(s) Administered  . Influenza,inj,Quad PF,6+ Mos 12/28/2017  . Influenza-Unspecified 04/20/2014  . PFIZER SARS-COV-2 Vaccination 06/03/2019, 06/24/2019  . PPD Test 02/14/2015  . Td 01/10/2016, 02/07/2016  .  Tdap 06/18/2010   We updated and reviewed the patient's past history in detail and it is documented below. Allergies: Patient is allergic to levaquin [levofloxacin] and nexium [esomeprazole]. Past Medical History Patient  has a past medical history of Fatigue, Hyperactive, Lower half migraine, Palpitations, and  Seizures (HCC). Past Surgical History Patient  has no past surgical history on file. Family History: Patient family history includes Heart attack in her father. Social History:  Patient  reports that she has never smoked. She has never used smokeless tobacco. She reports that she does not drink alcohol and does not use drugs.  Review of Systems: Constitutional: negative for fever or malaise Ophthalmic: negative for photophobia, double vision or loss of vision Cardiovascular: negative for chest pain, dyspnea on exertion, or new LE swelling Respiratory: negative for SOB or persistent cough Gastrointestinal: negative for abdominal pain, change in bowel habits or melena Genitourinary: negative for dysuria or gross hematuria, no abnormal uterine bleeding or disharge Musculoskeletal: negative for new gait disturbance or muscular weakness Integumentary: negative for new or persistent rashes, no breast lumps Neurological: negative for TIA or stroke symptoms Psychiatric: negative for SI or delusions Allergic/Immunologic: negative for hives  Patient Care Team    Relationship Specialty Notifications Start End  Willow Ora, MD PCP - General Family Medicine  08/10/19   Carla Drape Consulting Physician Gastroenterology  08/10/19   Mamie Laurel, MD Referring Physician General Surgery  08/10/19     Objective  Vitals: BP 114/68   Pulse 74   Temp 98.1 F (36.7 C) (Temporal)   Ht 5\' 4"  (1.626 m)   Wt 137 lb (62.1 kg)   SpO2 98%   BMI 23.52 kg/m  General:  Well developed, well nourished, no acute distress  Psych:  Alert and orientedx3,normal mood and affect HEENT:  Normocephalic, atraumatic, non-icteric sclera,  supple neck without adenopathy, mass or thyromegaly Cardiovascular:  Normal S1, S2, RRR without gallop, rub or murmur Respiratory:  Good breath sounds bilaterally, CTAB with normal respiratory effort Gastrointestinal: normal bowel sounds, soft, non-tender, no noted masses. No  HSM MSK: no deformities, contusions. Joints are without erythema or swelling.  Skin:  Warm, no rashes or suspicious lesions noted, no scalp lesions or rash Neurologic:    Pt declined breast exam today   Commons side effects, risks, benefits, and alternatives for medications and treatment plan prescribed today were discussed, and the patient expressed understanding of the given instructions. Patient is instructed to call or message via MyChart if he/she has any questions or concerns regarding our treatment plan. No barriers to understanding were identified. We discussed Red Flag symptoms and signs in detail. Patient expressed understanding regarding what to do in case of urgent or emergency type symptoms.   Medication list was reconciled, printed and provided to the patient in AVS. Patient instructions and summary information was reviewed with the patient as documented in the AVS. This note was prepared with assistance of Dragon voice recognition software. Occasional wrong-word or sound-a-like substitutions may have occurred due to the inherent limitations of voice recognition software  This visit occurred during the SARS-CoV-2 public health emergency.  Safety protocols were in place, including screening questions prior to the visit, additional usage of staff PPE, and extensive cleaning of exam room while observing appropriate contact time as indicated for disinfecting solutions.

## 2019-12-16 NOTE — Patient Instructions (Addendum)
Please return in 12 months for your annual complete physical; please come fasting.  I will release your lab results to you on your MyChart account with further instructions. Please reply with any questions.   Today you were given your flu vaccination.   If you have any questions or concerns, please don't hesitate to send me a message via MyChart or call the office at [phone]. Thank you for visiting with [place] today! It's our pleasure caring for you.  I have ordered a mammogram and/or bone density for you as we discussed today: [x]   Mammogram  []   Bone Density  Please call the office checked below to schedule your appointment: Your appointment will at the following location  [x]   [facility]      [address] [place],        [address] Wing         []   [facility]  [address] [place],  [facility]

## 2019-12-19 LAB — CBC WITH DIFFERENTIAL/PLATELET
Absolute Monocytes: 478 cells/uL (ref 200–950)
Basophils Absolute: 18 cells/uL (ref 0–200)
Basophils Relative: 0.3 %
Eosinophils Absolute: 47 cells/uL (ref 15–500)
Eosinophils Relative: 0.8 %
HCT: 36.4 % (ref 35.0–45.0)
Hemoglobin: 11.9 g/dL (ref 11.7–15.5)
Lymphs Abs: 1882 cells/uL (ref 850–3900)
MCH: 30.1 pg (ref 27.0–33.0)
MCHC: 32.7 g/dL (ref 32.0–36.0)
MCV: 92.2 fL (ref 80.0–100.0)
MPV: 11.6 fL (ref 7.5–12.5)
Monocytes Relative: 8.1 %
Neutro Abs: 3475 cells/uL (ref 1500–7800)
Neutrophils Relative %: 58.9 %
Platelets: 252 10*3/uL (ref 140–400)
RBC: 3.95 10*6/uL (ref 3.80–5.10)
RDW: 12.1 % (ref 11.0–15.0)
Total Lymphocyte: 31.9 %
WBC: 5.9 10*3/uL (ref 3.8–10.8)

## 2019-12-19 LAB — COMPLETE METABOLIC PANEL WITH GFR
AG Ratio: 1.3 (calc) (ref 1.0–2.5)
ALT: 15 U/L (ref 6–29)
AST: 17 U/L (ref 10–35)
Albumin: 4.1 g/dL (ref 3.6–5.1)
Alkaline phosphatase (APISO): 64 U/L (ref 31–125)
BUN: 8 mg/dL (ref 7–25)
CO2: 28 mmol/L (ref 20–32)
Calcium: 9.4 mg/dL (ref 8.6–10.2)
Chloride: 102 mmol/L (ref 98–110)
Creat: 0.66 mg/dL (ref 0.50–1.10)
GFR, Est African American: 122 mL/min/{1.73_m2} (ref >=60)
GFR, Est Non African American: 105 mL/min/{1.73_m2} (ref >=60)
Globulin: 3.1 g/dL (calc) (ref 1.9–3.7)
Glucose, Bld: 92 mg/dL (ref 65–99)
Potassium: 4.4 mmol/L (ref 3.5–5.3)
Sodium: 137 mmol/L (ref 135–146)
Total Bilirubin: 0.7 mg/dL (ref 0.2–1.2)
Total Protein: 7.2 g/dL (ref 6.1–8.1)

## 2019-12-19 LAB — LIPID PANEL
Cholesterol: 174 mg/dL (ref <200)
HDL: 47 mg/dL — ABNORMAL LOW (ref >=50)
LDL Cholesterol (Calc): 110 mg/dL (calc) — ABNORMAL HIGH
Non-HDL Cholesterol (Calc): 127 mg/dL (calc) (ref <130)
Total CHOL/HDL Ratio: 3.7 (calc) (ref <5.0)
Triglycerides: 77 mg/dL (ref <150)

## 2019-12-19 LAB — HEPATITIS C ANTIBODY
Hepatitis C Ab: NONREACTIVE
SIGNAL TO CUT-OFF: 0.2 (ref <1.00)

## 2019-12-19 LAB — TSH: TSH: 3.43 mIU/L

## 2020-01-03 ENCOUNTER — Ambulatory Visit: Payer: Commercial Managed Care - PPO

## 2020-01-04 ENCOUNTER — Ambulatory Visit
Admission: RE | Admit: 2020-01-04 | Discharge: 2020-01-04 | Disposition: A | Discharge status: Home / Self Care | Payer: Commercial Managed Care - PPO | Source: Ambulatory Visit | Attending: Family Medicine | Admitting: Family Medicine

## 2020-01-04 ENCOUNTER — Other Ambulatory Visit: Payer: Self-pay

## 2020-01-04 DIAGNOSIS — Z1231 Encounter for screening mammogram for malignant neoplasm of breast: Secondary | ICD-10-CM

## 2020-01-10 ENCOUNTER — Other Ambulatory Visit: Payer: Self-pay | Admitting: Family Medicine

## 2020-01-10 DIAGNOSIS — R928 Other abnormal and inconclusive findings on diagnostic imaging of breast: Secondary | ICD-10-CM

## 2020-01-25 ENCOUNTER — Ambulatory Visit
Admission: RE | Admit: 2020-01-25 | Discharge: 2020-01-25 | Disposition: A | Discharge status: Home / Self Care | Payer: Commercial Managed Care - PPO | Source: Ambulatory Visit | Attending: Family Medicine | Admitting: Family Medicine

## 2020-01-25 ENCOUNTER — Other Ambulatory Visit: Payer: Self-pay

## 2020-01-25 DIAGNOSIS — R928 Other abnormal and inconclusive findings on diagnostic imaging of breast: Secondary | ICD-10-CM

## 2020-02-08 ENCOUNTER — Other Ambulatory Visit: Payer: Self-pay | Admitting: Neurology

## 2020-02-13 ENCOUNTER — Other Ambulatory Visit: Payer: Self-pay | Admitting: Neurology

## 2020-02-13 MED ORDER — LEVETIRACETAM 500 MG PO TABS: ORAL_TABLET | ORAL | 2 refills | Status: DC

## 2020-02-13 NOTE — Telephone Encounter (Signed)
Sent refill to pharmacy as requested.  

## 2020-02-13 NOTE — Telephone Encounter (Signed)
Pt is needing her levETIRAcetam (KEPPRA) 500 MG tablet called in to the [facility] on [address]

## 2020-02-27 ENCOUNTER — Other Ambulatory Visit: Payer: Self-pay | Admitting: Gastroenterology

## 2020-02-27 DIAGNOSIS — R1011 Right upper quadrant pain: Secondary | ICD-10-CM

## 2020-11-07 ENCOUNTER — Ambulatory Visit: Payer: Commercial Managed Care - PPO | Admitting: Neurology

## 2020-12-04 ENCOUNTER — Ambulatory Visit (INDEPENDENT_AMBULATORY_CARE_PROVIDER_SITE_OTHER): Payer: Commercial Managed Care - PPO | Admitting: Neurology

## 2020-12-04 ENCOUNTER — Other Ambulatory Visit: Payer: Self-pay

## 2020-12-04 ENCOUNTER — Encounter: Payer: Self-pay | Admitting: Neurology

## 2020-12-04 VITALS — BP 105/70 | HR 63 | Ht 64.0 in | Wt 135.0 lb

## 2020-12-04 DIAGNOSIS — Z8669 Personal history of other diseases of the nervous system and sense organs: Secondary | ICD-10-CM | POA: Diagnosis not present

## 2020-12-04 MED ORDER — LEVETIRACETAM ER 500 MG PO TB24: 500.0000 mg | ORAL_TABLET | Freq: Every day | ORAL | 3 refills | Status: DC

## 2020-12-04 NOTE — Patient Instructions (Signed)
I had a long discussion with patient regarding her remote history of seizures which appear to be quite well controlled on the current medication regimen of Keppra.  I recommend that she switch to Keppra XR find a milligram once daily as she is often misses her a.m. dose.  She was given a prescription with refills for a year.  She was advised to follow-up with Dr. Opal Sidles her gastroenterologist for symptoms of reflux.  She will return for follow-up in the future in 1 year or call earlier if necessary.

## 2020-12-04 NOTE — Progress Notes (Signed)
[facility] [address] [place] [address] [phone]       OFFICE FOLLOW UP VISIT NOTE  [name] Date of Birth:  07/07/1970 Medical Record Number:  [id number]   Referring MD:  London Pepper  Reason for Referral:  seizures  HPI: 45 year [place] lady who recently returned from [place] where she had  hospitalization for a repetitive seizures and abnormal MRI scan of the brain. She is accompanied today by husband and has brought with her extensive hospital records, several MRI scans and lab results which I have  personally reviewed. She started feeling sick   actually the first week of June 2015 several days prior to going to [place]. She was felt to have a sinus infection and treated with Augmentin for sinusitis and Zofran for nausea. She felt tired after this and the day prior to her trip her husband noted that she looked pale and had head was turned to one side and she'll not fully responsive. EMS were called and patient vomited. She was taken to the long emergency room where she was felt to have near syncope and dehydration. She was treated with IV fluids. CT scan of the head, x-rays EKG and lab work were unremarkable. She was discharged on and felt better next morning after taking Sudafed. She traveled to [place] up with felt unwell. She started hearing voices in her head which were talking to each other. She is more able to concentrate and spent time with relatives. She felt better after she rested. Several days later after she had stayed up late in the night the husband noticed that she will have a generalized clonic tonic seizure. She was noted as morning with tossing and turning with teeth clenching. She was taken to the  [facility] hospital where she was diagnosed having seizures and treated with diazepam. She fell asleep. Next-day she was asked to see a neurologist who advised an MRI scan and EEG. She started having speech difficulties and  stopping  midsentence   and unable to complete sentences. The patient's husband [name] L4 medial which have looked at in which patient is making vocalizations but not able to form words. Patient was subsequently hospitalized and underwent a spinal tap which was reportedly normal and an MRI scan of the brain which showed weak diffusion positive lesion in the left medial frontal lobe cingulate gyrus. There were no ADC map images.This lesion was also seen on T2 and flair images and   a postcontrast MRI showed a venous angioma adjacent to this lesion. Lesion was felt to represent cortical dysplasia versus postictal changes from repititive seizures.. The patient was initially started on Trileptal and phenytoin but continued to have auditory hallucinations and some speech and word finding difficulties but eventually she was switched to [place] and she had a miraculous response to this and voices in her head stopped and her speech improved and she's not had any further seizures since then . She however has had some paranoid ideation and is having thoughts of fears of separation from her family and thoughts like ironing clothes we'll start a fire. She is currently taking Keppra 500 twice daily and Trileptal 300 twice daily. She had lab work done on 10/18/13 which showed low sodium of 133 but normal WBC and electrolytes and liver functions otherwise. Update 12/30/2013 : She returns for followup after last visit 3 months ago. She is doing well and has not had any recurrent seizures. She has tapered and  discontinued Trileptal. She is currently on Keppra 500 twice daily. She does complain of some excessive sleepiness and tiredness. She is continues to have mild short-term memory and cognitive difficulties but some of these 3 seated her seizures. Husband is concerned that at times she forgets recent conversations and events. She had EEG done which I have personally reviewed and was normal. A followup MRI scan of the brain done on 12/08/13  personally reviewed shows left frontal small venous angioma without evidence of significant hemorrhage. The left cingulate gyrus swelling and cytotoxic edema have completely resolved compared with previous MRI from June 2015 from [place] Update 08/11/2014 : She returns for follow-up after last visit 7 months ago. She continues to do well without any recurrent seizures. She has been quite compliant with Keppra which she is taking regularly. She is tolerating it well without significant side effects. She does complain of slight fatigue as well as palpitations but this may be related to underlying anxiety and some element of posttraumatic stress. She has not been participating in any regular activities for stress relaxation. Update 02/21/2015 : She returns for follow-up after last visit 6 months ago. She is accompanied by husband. She continues to do well without recurrent seizures. She has been compliant with Keppra which she is taking regularly and tolerating it quite well. She continues to have slight fatigue as well as anxiety. She has not been participating in regular activities for stress laxation. She has questions about how long she needs to stay on Keppra and I spent a lot of time counseling patient and her husband the need for long-term anticonvulsants given the fact she had multiple seizures Update 08/20/2015 : She returns for follow-up after last visit 6 months ago. She is accompanied by her husband. She is doing well and has not had recurrent seizures now for nearly 2 years. The she is tolerating Keppra 500 mg twice daily quite well without any side effects. Did have a transient episode of pain in the left hand and does have occasional knee and joint pains but otherwise is doing quite well. She wants to lose weight but has not been successful. She has no new complaints today. She and her husband again had questions about the need for and duration of long-term anti-convulsant therapy. Update 09/02/2016 ; She  returns for follow-up after last visit a year ago. She continues to do well without recurrent seizures now for 3 years. She is starting Keppra 500 mg twice daily without any side effects. She has recently started working as a Lawyer. She is very careful and regular with her sleeping and eating habits and avoids driving at night. She has no new neurological complaints today. She has had no new health problems since last 1 year. Update 11/12/2017 : She returns for follow-up after last visit a year ago. She continues to do well and hasn't not had recurrent seizures for greater than 3 years. She is tolerating Keppra 500 mg twice daily without any side effects. She has no neurological complaints no new health problems since last year. She plans to see her primary care physician for new complaints of knee pain which are not quite bothersome. Update 11/22/2018 : She returns for follow-up after last visit a year ago.  She is doing well and has not had recurrent seizures now for 4 years.  She remains on Keppra 500 mg twice daily which is tolerating well without any side effects.  She has no neurological complaints today.  She has  been having some chronic right-sided abdominal pain and saw her primary physician who has recently referred her to gastroenterologist that appointment has not yet happened.  She is working from home due to the coronavirus pandemic.   Update 11/07/2019 : She returns for follow-up after last visit a year ago.  She continues to do well and has not had any breakthrough seizures or aura now for greater than 5 years.  She remains on Keppra 500 mg twice daily which she seems to be tolerating well.  She is still working from home due to [employer].  She has had no new neurological or other health related issues.  She has no complaints today.  She states she has trouble abducting to change and has trouble sleeping if she is she is not sleeping at the same time in her own home recently she returned from  a long road trip felt her sleep was difficult.  No other complaints Updates 12/04/2020 : She returns for follow-up after last visit a year ago.  She continues to do well and not had any breakthrough seizures now for well over 6 years.  At last visit we reduced the dose of Keppra to 50 mg in the morning and find it at night.  She admits that she often misses the a.m. dose and has been taking only find a milligram at night and not had any breakthrough seizures.  She does not have any neurological complaints.  Patient had some abdominal pain and has been found to have gallstones with a dilated biliary duct and had an ERCP done.  She is still complaining of a lot of burning in the mouth and advised her to discuss this with her gastroenterologist Dr. Opal Sidles.  She has been taking omeprazole for reflux symptoms ROS:   14 system review of systems is positive for no complaints today and all other systems negative. PMH:  Past Medical History:  Diagnosis Date   GERD (gastroesophageal reflux disease)    Hyperactive    Lower half migraine    Seizures (HCC)     Social History:  Social History   Socioeconomic History   Marital status: Married    Spouse name: Not on file   Number of children: 2   Years of education: degree   Highest education level: Not on file  Occupational History   Occupation: school    Employer: UNEMPLOYED  Tobacco Use   Smoking status: Never   Smokeless tobacco: Never  Substance and Sexual Activity   Alcohol use: No   Drug use: No   Sexual activity: Yes  Other Topics Concern   Not on file  Social History Narrative   Patient lives with her husband and 2 children   Patient is right handed    Social Determinants of Corporate investment banker Strain: Not on file  Food Insecurity: Not on file  Transportation Needs: Not on file  Physical Activity: Not on file  Stress: Not on file  Social Connections: Not on file  Intimate Partner Violence: Not on file    Medications:    Current Outpatient Medications on File Prior to Visit  Medication Sig Dispense Refill   acetaminophen (TYLENOL) 325 MG tablet Take 650 mg by mouth every 6 (six) hours as needed.     clonazePAM (KLONOPIN) 0.25 MG disintegrating tablet Take 1-2 tablets (0.25-0.5 mg total) by mouth 2 (two) times daily. 60 tablet 2   diclofenac (VOLTAREN) 75 MG EC tablet Take 1 tablet (75 mg total)  by mouth 2 (two) times daily with a meal. 30 tablet 0   ibuprofen (ADVIL,MOTRIN) 100 MG tablet Take 100 mg by mouth every 6 (six) hours as needed for fever.     omeprazole (PRILOSEC) 40 MG capsule Take 40 mg by mouth daily.     Prenatal Vit-Fe Fumarate-FA (PRENATAL MULTIVITAMIN) TABS tablet Take 1 tablet by mouth daily at 12 noon.     No current facility-administered medications on file prior to visit.    Allergies:   Allergies  Allergen Reactions   Levaquin [Levofloxacin] Nausea And Vomiting   Nexium [Esomeprazole] Nausea And Vomiting    Dizziness.     Physical Exam General: well developed, well nourished young  [place] lady, seated, in no evident distress Head: head normocephalic and atraumatic.   Neck: supple with no carotid or supraclavicular bruits Cardiovascular: regular rate and rhythm, no murmurs Musculoskeletal: no deformity Skin:  no rash/petichiae Vascular:  Normal pulses all extremities Vitals:   12/04/20 1035  BP: 105/70  Pulse: 63    Neurologic Exam Mental Status: Awake and fully alert. Oriented to place and time. Recent and remote memory intact. . Attention span, concentration and fund of knowledge appropriate. Mood and affect appropriate. No paranoid ideation noted. Mini-Mental Status exam not done. Cranial Nerves: Fundoscopic exam not done . Pupils equal, briskly reactive to light. Extraocular movements full without nystagmus. Visual fields full to confrontation. Hearing intact. Facial sensation intact. Face, tongue, palate moves normally and symmetrically.  Motor: Normal bulk  and tone. Normal strength in all tested extremity muscles. Sensory.: intact to touch and pinprick and vibratory sensation.  Coordination: Rapid alternating movements normal in all extremities. Finger-to-nose and heel-to-shin performed accurately bilaterally. Gait and Station: Arises from chair without difficulty. Stance is normal. Gait demonstrates normal stride length and balance . Able to heel, toe and tandem walk with slight difficulty.  Reflexes: 1+ and symmetric. Toes downgoing.      ASSESSMENT: 72 year [place] lady with recent episodes of complex partial, simple partial and partial onset seizures with secondary generalization in June 2015 symptomatic from left frontal lesion etiology indeterminate. Adjacent venous angioma with  post ictal MRI changes from repetitive seizures. Seizures seem well controlled    PLAN: I had a long discussion with patient regarding her remote history of seizures which appear to be quite well controlled on the current medication regimen of Keppra.  I recommend that she switch to Keppra XR find a milligram once daily as she is often misses her a.m. dose.  She was given a prescription with refills for a year.  She was advised to follow-up with Dr. Opal Sidles her gastroenterologist for symptoms of reflux.  She will return for follow-up in the future in 1 year or call earlier if necessary. Greater than 50% time during this 25 minute visit was spent on counseling and coordination of care about seizures and prevention of recurrence. Delia Heady, MD    Note: This document was prepared with digital dictation and possible smart phrase technology. Any transcriptional errors that result from this process are unintentional.

## 2020-12-24 ENCOUNTER — Encounter: Payer: Self-pay | Admitting: Family Medicine

## 2020-12-24 ENCOUNTER — Ambulatory Visit (INDEPENDENT_AMBULATORY_CARE_PROVIDER_SITE_OTHER): Payer: Commercial Managed Care - PPO | Admitting: Family Medicine

## 2020-12-24 ENCOUNTER — Other Ambulatory Visit: Payer: Self-pay

## 2020-12-24 VITALS — BP 122/80 | HR 64 | Temp 98.1°F | Ht 64.0 in | Wt 137.6 lb

## 2020-12-24 DIAGNOSIS — Z23 Encounter for immunization: Secondary | ICD-10-CM

## 2020-12-24 DIAGNOSIS — K2270 Barrett's esophagus without dysplasia: Secondary | ICD-10-CM

## 2020-12-24 DIAGNOSIS — K2100 Gastro-esophageal reflux disease with esophagitis, without bleeding: Secondary | ICD-10-CM

## 2020-12-24 DIAGNOSIS — K117 Disturbances of salivary secretion: Secondary | ICD-10-CM

## 2020-12-24 DIAGNOSIS — G40009 Localization-related (focal) (partial) idiopathic epilepsy and epileptic syndromes with seizures of localized onset, not intractable, without status epilepticus: Secondary | ICD-10-CM

## 2020-12-24 DIAGNOSIS — L659 Nonscarring hair loss, unspecified: Secondary | ICD-10-CM

## 2020-12-24 DIAGNOSIS — N951 Menopausal and female climacteric states: Secondary | ICD-10-CM

## 2020-12-24 DIAGNOSIS — K146 Glossodynia: Secondary | ICD-10-CM | POA: Diagnosis not present

## 2020-12-24 DIAGNOSIS — Z0000 Encounter for general adult medical examination without abnormal findings: Secondary | ICD-10-CM | POA: Diagnosis not present

## 2020-12-24 LAB — VITAMIN D 25 HYDROXY (VIT D DEFICIENCY, FRACTURES): VITD: 18.32 ng/mL — ABNORMAL LOW (ref 30.00–100.00)

## 2020-12-24 LAB — B12 AND FOLATE PANEL
Folate: 15.9 ng/mL (ref >5.9)
Vitamin B-12: 369 pg/mL (ref 211–911)

## 2020-12-24 LAB — COMPREHENSIVE METABOLIC PANEL
ALT: 11 U/L (ref 0–35)
AST: 13 U/L (ref 0–37)
Albumin: 3.8 g/dL (ref 3.5–5.2)
Alkaline Phosphatase: 61 U/L (ref 39–117)
BUN: 7 mg/dL (ref 6–23)
CO2: 27 mEq/L (ref 19–32)
Calcium: 9.1 mg/dL (ref 8.4–10.5)
Chloride: 104 mEq/L (ref 96–112)
Creatinine, Ser: 0.63 mg/dL (ref 0.40–1.20)
GFR: 104.5 mL/min (ref >60.00)
Glucose, Bld: 85 mg/dL (ref 70–99)
Potassium: 3.7 mEq/L (ref 3.5–5.1)
Sodium: 138 mEq/L (ref 135–145)
Total Bilirubin: 0.7 mg/dL (ref 0.2–1.2)
Total Protein: 6.6 g/dL (ref 6.0–8.3)

## 2020-12-24 LAB — CBC WITH DIFFERENTIAL/PLATELET
Basophils Absolute: 0 10*3/uL (ref 0.0–0.1)
Basophils Relative: 0.3 % (ref 0.0–3.0)
Eosinophils Absolute: 0 10*3/uL (ref 0.0–0.7)
Eosinophils Relative: 0.7 % (ref 0.0–5.0)
HCT: 34.2 % — ABNORMAL LOW (ref 36.0–46.0)
Hemoglobin: 11.5 g/dL — ABNORMAL LOW (ref 12.0–15.0)
Lymphocytes Relative: 35.1 % (ref 12.0–46.0)
Lymphs Abs: 2.2 10*3/uL (ref 0.7–4.0)
MCHC: 33.7 g/dL (ref 30.0–36.0)
MCV: 90.5 fl (ref 78.0–100.0)
Monocytes Absolute: 0.4 10*3/uL (ref 0.1–1.0)
Monocytes Relative: 6.8 % (ref 3.0–12.0)
Neutro Abs: 3.5 10*3/uL (ref 1.4–7.7)
Neutrophils Relative %: 57.1 % (ref 43.0–77.0)
Platelets: 214 10*3/uL (ref 150.0–400.0)
RBC: 3.78 Mil/uL — ABNORMAL LOW (ref 3.87–5.11)
RDW: 13.9 % (ref 11.5–15.5)
WBC: 6.2 10*3/uL (ref 4.0–10.5)

## 2020-12-24 LAB — LIPID PANEL
Cholesterol: 164 mg/dL (ref 0–200)
HDL: 49.8 mg/dL (ref >39.00)
LDL Cholesterol: 102 mg/dL — ABNORMAL HIGH (ref 0–99)
NonHDL: 114.67
Total CHOL/HDL Ratio: 3
Triglycerides: 63 mg/dL (ref 0.0–149.0)
VLDL: 12.6 mg/dL (ref 0.0–40.0)

## 2020-12-24 LAB — TSH: TSH: 2.34 u[IU]/mL (ref 0.35–5.50)

## 2020-12-24 LAB — SEDIMENTATION RATE: Sed Rate: 39 mm/hr — ABNORMAL HIGH (ref 0–20)

## 2020-12-24 MED ORDER — CLONAZEPAM 1 MG PO TBDP: 1.0000 mg | ORAL_TABLET | Freq: Every day | ORAL | 5 refills | Status: DC

## 2020-12-24 NOTE — Patient Instructions (Signed)
Please return in 12 months for your annual complete physical; please come fasting.   I will release your lab results to you on your MyChart account with further instructions. Please reply with any questions.    I am not certain what is causing your symptoms: please try the larger dose of the klonopin nighlty; let it dissolve on your tongue. Start oral Vit B12 1000 mg daily.   I am checking several different things to ensure I don't see other causes. I have placed a referral to a dermatologist. Consider a trial of rogaine in the meantime.   If you have any questions or concerns, please don't hesitate to send me a message via MyChart or call the office at [phone]. Thank you for visiting with [place] today! It's our pleasure caring for you.

## 2020-12-24 NOTE — Progress Notes (Signed)
Subjective  Chief Complaint  Patient presents with   Annual Exam    Fasting   Burning mouth    HPI: Andrea Berg is a 50 y.o. female who presents to [facility] [place] today for a Female Wellness Visit. She also has the concerns and/or needs as listed above in the chief complaint. These will be addressed in addition to the Health Maintenance Visit.   Wellness Visit: annual visit with health maintenance review and exam without Pap  HM: mammo due end of next month; discussed indications and  harms vs benefits. Pap is up to date. GI is following and considering a colonoscopy for screening in the near future. Eligible for flu vaccine. Overall doing well Chronic disease f/u and/or acute problem visit: (deemed necessary to be done in addition to the wellness visit): GI: reviewed notes: on high dose PPI for GERD/baretts. No stomach pain but c/o acidity and salivation in the mouth with burning sensation. Ongoing problem w/o clear cause. ? Related to gerd or nerve issues or other. Recent ERCP due RUQ pain. Biliary sludge.  Seizure d/o: stable on keppra; reviewed neurology.  C/o hair thinning, mostly frontal for last year. No patchy hair loss or rash C/o skipped menses here and there, hot flushes, changes in sleep. Does not do well with ocps.   Assessment  1. Annual physical exam   2. Barrett's esophagus without dysplasia   3. Gastroesophageal reflux disease with esophagitis without hemorrhage   4. Partial idiopathic epilepsy with seizures of localized onset, not intractable, without status epilepticus (HCC)   5. Burning mouth syndrome   6. Hair thinning   7. Ptyalism      Plan  Female Wellness Visit: Age appropriate Health Maintenance and Prevention measures were discussed with patient. Included topics are cancer screening recommendations, ways to keep healthy (see AVS) including dietary and exercise recommendations, regular eye and dental care, use of seat belts, and  avoidance of moderate alcohol use and tobacco use. Pt to schedule mammogram BMI: discussed patient's BMI and encouraged positive lifestyle modifications to help get to or maintain a target BMI. HM needs and immunizations were addressed and ordered. See below for orders. See HM and immunization section for updates. Flu vaccine today Routine labs and screening tests ordered including cmp, cbc and lipids where appropriate. Discussed recommendations regarding Vit D and calcium supplementation (see AVS)  Chronic disease management visit and/or acute problem visit: GERD:per GI. High dose omeprazole. Rec b12 supplement. Check levels on prnatal vit supp(per rec of prior pcp) Burning mouth: ? Etiology. Trial of high dose klonopin 1mg  nightly. Check labs for sjogren, thyroid and vitamin deficiencies Hair thinning: suspect female pattern thinning: runs in her family. Trial of rogaine and refer to derm for other options.  Seizures: stable on keppra Perimenopause: discussed sxs and QOL> will monitor menses. Behavioral management for now.   Follow up: Return in about 6 months (around 06/23/2021) for complete physical.  Orders Placed This Encounter  Procedures   CBC with Differential/Platelet   B12 and Folate Panel   Comprehensive metabolic panel   Lipid panel   TSH   VITAMIN D 25 Hydroxy (Vit-D Deficiency, Fractures)   Antinuclear Antib (ANA)   Sedimentation rate   Sjogrens syndrome-A extractable nuclear antibody   Sjogrens syndrome-B extractable nuclear antibody   Ambulatory referral to Dermatology   Meds ordered this encounter  Medications   clonazePAM (KLONOPIN) 1 MG disintegrating tablet    Sig: Take 1 tablet (1 mg total)  by mouth at bedtime.    Dispense:  30 tablet    Refill:  5       Body mass index is 23.62 kg/m. Wt Readings from Last 3 Encounters:  12/24/20 137 lb 9.6 oz (62.4 kg)  12/04/20 135 lb (61.2 kg)  12/16/19 137 lb (62.1 kg)     Patient Active Problem List    Diagnosis Date Noted   Burning mouth syndrome 08/10/2019   Barrett's esophagus without dysplasia 08/10/2019   Biliary colic symptom 16/12/9602   Biliary sludge 08/10/2019   Gastroesophageal reflux disease with esophagitis without hemorrhage 08/10/2019   Epilepsy (HCC) 10/19/2013   Other specified congenital anomalies of brain 10/19/2013   Health Maintenance  Topic Date Due   COVID-19 Vaccine (3 - Booster for Pfizer series) 11/24/2019   INFLUENZA VACCINE  10/29/2020   COLONOSCOPY (Pts 45-28yrs Insurance coverage will need to be confirmed)  12/24/2021 (Originally 01/06/2017)   MAMMOGRAM  01/24/2021   PAP SMEAR-Modifier  12/29/2022   TETANUS/TDAP  02/06/2026   Hepatitis C Screening  Completed   HIV Screening  Completed   HPV VACCINES  Aged Out   Immunization History  Administered Date(s) Administered   Influenza,inj,Quad PF,6+ Mos 12/28/2017, 12/16/2019   Influenza-Unspecified 04/20/2014   PFIZER(Purple Top)SARS-COV-2 Vaccination 06/03/2019, 06/24/2019   PPD Test 02/14/2015   Td 01/10/2016, 02/07/2016   Tdap 06/18/2010   We updated and reviewed the patient's past history in detail and it is documented below. Allergies: Patient is allergic to levofloxacin and nexium [esomeprazole]. Past Medical History Patient  has a past medical history of GERD (gastroesophageal reflux disease), Hyperactive, Lower half migraine, and Seizures (HCC). Past Surgical History Patient  has no past surgical history on file. Family History: Patient family history includes Heart attack in her father. Social History:  Patient  reports that she has never smoked. She has never used smokeless tobacco. She reports that she does not drink alcohol and does not use drugs.  Review of Systems: Constitutional: negative for fever or malaise Ophthalmic: negative for photophobia, double vision or loss of vision Cardiovascular: negative for chest pain, dyspnea on exertion, or new LE swelling Respiratory: negative  for SOB or persistent cough Gastrointestinal: negative for abdominal pain, change in bowel habits or melena Genitourinary: negative for dysuria or gross hematuria, no abnormal uterine bleeding or disharge Musculoskeletal: negative for new gait disturbance or muscular weakness Integumentary: negative for new or persistent rashes, no breast lumps Neurological: negative for TIA or stroke symptoms Psychiatric: negative for SI or delusions Allergic/Immunologic: negative for hives  Patient Care Team    Relationship Specialty Notifications Start End  Willow Ora, MD PCP - General Family Medicine  08/10/19   Carla Drape Consulting Physician Gastroenterology  08/10/19   Mamie Laurel, MD Referring Physician General Surgery  08/10/19     Objective  Vitals: BP 122/80   Pulse 64   Temp 98.1 F (36.7 C) (Temporal)   Ht 5\' 4"  (1.626 m)   Wt 137 lb 9.6 oz (62.4 kg)   LMP 12/13/2020 (Exact Date)   SpO2 100%   BMI 23.62 kg/m  General:  Well developed, well nourished, no acute distress  Psych:  Alert and orientedx3,normal mood and affect HEENT:  Normocephalic, atraumatic, non-icteric sclera,  supple neck without adenopathy, mass or thyromegaly Cardiovascular:  Normal S1, S2, RRR without gallop, rub or murmur Respiratory:  Good breath sounds bilaterally, CTAB with normal respiratory effort Gastrointestinal: normal bowel sounds, soft, non-tender, no noted masses. No HSM MSK: no  deformities, contusions. Joints are without erythema or swelling.  Skin:  Warm, no rashes or suspicious lesions noted Neurologic:    Mental status is normal. CN 2-11 are normal. Gross motor and sensory exams are normal. Normal gait. No tremor   Commons side effects, risks, benefits, and alternatives for medications and treatment plan prescribed today were discussed, and the patient expressed understanding of the given instructions. Patient is instructed to call or message via MyChart if he/she has any questions or  concerns regarding our treatment plan. No barriers to understanding were identified. We discussed Red Flag symptoms and signs in detail. Patient expressed understanding regarding what to do in case of urgent or emergency type symptoms.  Medication list was reconciled, printed and provided to the patient in AVS. Patient instructions and summary information was reviewed with the patient as documented in the AVS. This note was prepared with assistance of Dragon voice recognition software. Occasional wrong-word or sound-a-like substitutions may have occurred due to the inherent limitations of voice recognition software  This visit occurred during the SARS-CoV-2 public health emergency.  Safety protocols were in place, including screening questions prior to the visit, additional usage of staff PPE, and extensive cleaning of exam room while observing appropriate contact time as indicated for disinfecting solutions.

## 2020-12-25 LAB — SJOGRENS SYNDROME-A EXTRACTABLE NUCLEAR ANTIBODY: SSA (Ro) (ENA) Antibody, IgG: <1 AI

## 2020-12-25 LAB — SJOGRENS SYNDROME-B EXTRACTABLE NUCLEAR ANTIBODY: SSB (La) (ENA) Antibody, IgG: <1 AI

## 2020-12-25 LAB — ANA: Anti Nuclear Antibody (ANA): NEGATIVE

## 2020-12-26 ENCOUNTER — Encounter: Payer: Self-pay | Admitting: Family Medicine

## 2020-12-27 ENCOUNTER — Telehealth: Payer: Self-pay

## 2020-12-27 ENCOUNTER — Encounter: Payer: Self-pay | Admitting: Family Medicine

## 2020-12-27 DIAGNOSIS — E559 Vitamin D deficiency, unspecified: Secondary | ICD-10-CM | POA: Insufficient documentation

## 2020-12-27 DIAGNOSIS — E538 Deficiency of other specified B group vitamins: Secondary | ICD-10-CM | POA: Insufficient documentation

## 2020-12-27 NOTE — Telephone Encounter (Signed)
Spoke with patient regarding labs.

## 2020-12-27 NOTE — Telephone Encounter (Signed)
Pt called for lab results. Please Advise.  

## 2020-12-31 ENCOUNTER — Telehealth: Payer: Self-pay

## 2020-12-31 NOTE — Telephone Encounter (Signed)
Pt called wanting to speak to a nurse about her medications. Please Advise.

## 2021-01-01 NOTE — Telephone Encounter (Signed)
LVM for patient to return call. 

## 2021-01-01 NOTE — Telephone Encounter (Signed)
Pt called back. Please Advise.  

## 2021-01-02 NOTE — Telephone Encounter (Signed)
Spoke with patient, gave a verbal understanding °

## 2021-01-07 ENCOUNTER — Encounter: Payer: Commercial Managed Care - PPO | Admitting: Family Medicine

## 2021-07-02 ENCOUNTER — Ambulatory Visit: Payer: Commercial Managed Care - PPO | Admitting: Dermatology

## 2021-07-03 ENCOUNTER — Ambulatory Visit: Payer: Commercial Managed Care - PPO | Admitting: Dermatology

## 2021-10-10 ENCOUNTER — Ambulatory Visit (INDEPENDENT_AMBULATORY_CARE_PROVIDER_SITE_OTHER): Payer: Commercial Managed Care - PPO | Admitting: Neurology

## 2021-10-10 ENCOUNTER — Encounter: Payer: Self-pay | Admitting: Neurology

## 2021-10-10 VITALS — BP 97/64 | HR 65 | Ht 64.0 in | Wt 140.0 lb

## 2021-10-10 DIAGNOSIS — Z8669 Personal history of other diseases of the nervous system and sense organs: Secondary | ICD-10-CM

## 2021-10-10 MED ORDER — LEVETIRACETAM ER 500 MG PO TB24: 500.0000 mg | ORAL_TABLET | Freq: Every day | ORAL | 3 refills | Status: DC

## 2021-10-10 NOTE — Progress Notes (Signed)
[facility] [address] [place] [place] [address] [phone]       OFFICE FOLLOW UP VISIT NOTE  [name] Date of Birth:  07/07/1970 Medical Record Number:  [id number]   Referring MD:  London Pepper  Reason for Referral:  seizures  HPI: 45 year [place] lady who recently returned from [place] where she had  hospitalization for a repetitive seizures and abnormal MRI scan of the brain. She is accompanied today by husband and has brought with her extensive hospital records, several MRI scans and lab results which I have  personally reviewed. She started feeling sick   actually the first week of June 2015 several days prior to going to [place]. She was felt to have a sinus infection and treated with Augmentin for sinusitis and Zofran for nausea. She felt tired after this and the day prior to her trip her husband noted that she looked pale and had head was turned to one side and she'll not fully responsive. EMS were called and patient vomited. She was taken to the long emergency room where she was felt to have near syncope and dehydration. She was treated with IV fluids. CT scan of the head, x-rays EKG and lab work were unremarkable. She was discharged on and felt better next morning after taking Sudafed. She traveled to [place] up with felt unwell. She started hearing voices in her head which were talking to each other. She is more able to concentrate and spent time with relatives. She felt better after she rested. Several days later after she had stayed up late in the night the husband noticed that she will have a generalized clonic tonic seizure. She was noted as morning with tossing and turning with teeth clenching. She was taken to the  [facility] hospital where she was diagnosed having seizures and treated with diazepam. She fell asleep. Next-day she was asked to see a neurologist who advised an MRI scan and EEG. She started having speech difficulties and  stopping  midsentence   and unable to complete sentences. The patient's husband [name] L4 medial which have looked at in which patient is making vocalizations but not able to form words. Patient was subsequently hospitalized and underwent a spinal tap which was reportedly normal and an MRI scan of the brain which showed weak diffusion positive lesion in the left medial frontal lobe cingulate gyrus. There were no ADC map images.This lesion was also seen on T2 and flair images and   a postcontrast MRI showed a venous angioma adjacent to this lesion. Lesion was felt to represent cortical dysplasia versus postictal changes from repititive seizures.. The patient was initially started on Trileptal and phenytoin but continued to have auditory hallucinations and some speech and word finding difficulties but eventually she was switched to [place] and she had a miraculous response to this and voices in her head stopped and her speech improved and she's not had any further seizures since then . She however has had some paranoid ideation and is having thoughts of fears of separation from her family and thoughts like ironing clothes we'll start a fire. She is currently taking Keppra 500 twice daily and Trileptal 300 twice daily. She had lab work done on 10/18/13 which showed low sodium of 133 but normal WBC and electrolytes and liver functions otherwise. Update 12/30/2013 : She returns for followup after last visit 3 months ago. She is doing well and has not had any recurrent seizures. She has tapered and  discontinued Trileptal. She is currently on Keppra 500 twice daily. She does complain of some excessive sleepiness and tiredness. She is continues to have mild short-term memory and cognitive difficulties but some of these 3 seated her seizures. Husband is concerned that at times she forgets recent conversations and events. She had EEG done which I have personally reviewed and was normal. A followup MRI scan of the brain done on 12/08/13  personally reviewed shows left frontal small venous angioma without evidence of significant hemorrhage. The left cingulate gyrus swelling and cytotoxic edema have completely resolved compared with previous MRI from June 2015 from [place] Update 08/11/2014 : She returns for follow-up after last visit 7 months ago. She continues to do well without any recurrent seizures. She has been quite compliant with Keppra which she is taking regularly. She is tolerating it well without significant side effects. She does complain of slight fatigue as well as palpitations but this may be related to underlying anxiety and some element of posttraumatic stress. She has not been participating in any regular activities for stress relaxation. Update 02/21/2015 : She returns for follow-up after last visit 6 months ago. She is accompanied by husband. She continues to do well without recurrent seizures. She has been compliant with Keppra which she is taking regularly and tolerating it quite well. She continues to have slight fatigue as well as anxiety. She has not been participating in regular activities for stress laxation. She has questions about how long she needs to stay on Keppra and I spent a lot of time counseling patient and her husband the need for long-term anticonvulsants given the fact she had multiple seizures Update 08/20/2015 : She returns for follow-up after last visit 6 months ago. She is accompanied by her husband. She is doing well and has not had recurrent seizures now for nearly 2 years. The she is tolerating Keppra 500 mg twice daily quite well without any side effects. Did have a transient episode of pain in the left hand and does have occasional knee and joint pains but otherwise is doing quite well. She wants to lose weight but has not been successful. She has no new complaints today. She and her husband again had questions about the need for and duration of long-term anti-convulsant therapy. Update 09/02/2016 ; She  returns for follow-up after last visit a year ago. She continues to do well without recurrent seizures now for 3 years. She is starting Keppra 500 mg twice daily without any side effects. She has recently started working as a Lawyer. She is very careful and regular with her sleeping and eating habits and avoids driving at night. She has no new neurological complaints today. She has had no new health problems since last 1 year. Update 11/12/2017 : She returns for follow-up after last visit a year ago. She continues to do well and hasn't not had recurrent seizures for greater than 3 years. She is tolerating Keppra 500 mg twice daily without any side effects. She has no neurological complaints no new health problems since last year. She plans to see her primary care physician for new complaints of knee pain which are not quite bothersome. Update 11/22/2018 : She returns for follow-up after last visit a year ago.  She is doing well and has not had recurrent seizures now for 4 years.  She remains on Keppra 500 mg twice daily which is tolerating well without any side effects.  She has no neurological complaints today.  She has  been having some chronic right-sided abdominal pain and saw her primary physician who has recently referred her to gastroenterologist that appointment has not yet happened.  She is working from home due to the coronavirus pandemic.   Update 11/07/2019 : She returns for follow-up after last visit a year ago.  She continues to do well and has not had any breakthrough seizures or aura now for greater than 5 years.  She remains on Keppra 500 mg twice daily which she seems to be tolerating well.  She is still working from home due to [employer].  She has had no new neurological or other health related issues.  She has no complaints today.  She states she has trouble abducting to change and has trouble sleeping if she is she is not sleeping at the same time in her own home recently she returned from  a long road trip felt her sleep was difficult.  No other complaints Updates 12/04/2020 : She returns for follow-up after last visit a year ago.  She continues to do well and not had any breakthrough seizures now for well over 6 years.  At last visit we reduced the dose of Keppra to 250 mg in the morning and 500 mg at night.  She admits that she often misses the a.m. dose and has been taking only find a milligram at night and not had any breakthrough seizures.  She does not have any neurological complaints.  Patient had some abdominal pain and has been found to have gallstones with a dilated biliary duct and had an ERCP done.  She is still complaining of a lot of burning in the mouth and advised her to discuss this with her gastroenterologist Dr. Christoper Fabian.  She has been taking omeprazole for reflux symptoms Update 10/10/2021 ; she returns for follow-up after last visit 10 months ago.  She is doing well.  She has tolerated changing the immediate release Keppra to Keppra XR 500 mg daily.  She is still very rarely occasionally misses a dose..  She has had no breakthrough seizures now for nearly 7 years following her initial flurry of seizures at the onset in 2015.  She has seen gastroenterologist for herleft facial reflux disease and has been taking famotidine which seems to help.  She works full-time.  She has no complaints today. ROS:   14 system review of systems is positive for no complaints today and all other systems negative. PMH:  Past Medical History:  Diagnosis Date   GERD (gastroesophageal reflux disease)    Hyperactive    Lower half migraine    Seizures ([place])     Social History:  Social History   Socioeconomic History   Marital status: Married    Spouse name: Not on file   Number of children: 2   Years of education: degree   Highest education level: Not on file  Occupational History   Occupation: school    Employer: UNEMPLOYED  Tobacco Use   Smoking status: Never   Smokeless tobacco:  Never  Substance and Sexual Activity   Alcohol use: No   Drug use: No   Sexual activity: Yes  Other Topics Concern   Not on file  Social History Narrative   Patient lives with her husband and 2 children   Patient is right handed    Social Determinants of Health   Financial Resource Strain: Not on file  Food Insecurity: Not on file  Transportation Needs: Not on file  Physical Activity: Not on  file  Stress: Not on file  Social Connections: Not on file  Intimate Partner Violence: Not on file    Medications:   Current Outpatient Medications on File Prior to Visit  Medication Sig Dispense Refill   acetaminophen (TYLENOL) 325 MG tablet Take 650 mg by mouth every 6 (six) hours as needed.     ibuprofen (ADVIL,MOTRIN) 100 MG tablet Take 100 mg by mouth every 6 (six) hours as needed for fever.     omeprazole (PRILOSEC) 40 MG capsule Take 40 mg by mouth daily.     No current facility-administered medications on file prior to visit.    Allergies:   Allergies  Allergen Reactions   Levofloxacin Nausea And Vomiting    Other reaction(s): Other insomnia   Nexium [Esomeprazole] Nausea And Vomiting    Dizziness.     Physical Exam General: well developed, well nourished young  [place] lady, seated, in no evident distress Head: head normocephalic and atraumatic.   Neck: supple with no carotid or supraclavicular bruits Cardiovascular: regular rate and rhythm, no murmurs Musculoskeletal: no deformity Skin:  no rash/petichiae Vascular:  Normal pulses all extremities Vitals:   10/10/21 1332  BP: 97/64  Pulse: 65    Neurologic Exam Mental Status: Awake and fully alert. Oriented to place and time. Recent and remote memory intact. . Attention span, concentration and fund of knowledge appropriate. Mood and affect appropriate. No paranoid ideation noted. Mini-Mental Status exam not done. Cranial Nerves: Fundoscopic exam not done . Pupils equal, briskly reactive to light.  Extraocular movements full without nystagmus. Visual fields full to confrontation. Hearing intact. Facial sensation intact. Face, tongue, palate moves normally and symmetrically.  Motor: Normal bulk and tone. Normal strength in all tested extremity muscles. Sensory.: intact to touch and pinprick and vibratory sensation.  Coordination: Rapid alternating movements normal in all extremities. Finger-to-nose and heel-to-shin performed accurately bilaterally. Gait and Station: Arises from chair without difficulty. Stance is normal. Gait demonstrates normal stride length and balance . Able to heel, toe and tandem walk with slight difficulty.  Reflexes: 1+ and symmetric. Toes downgoing.      ASSESSMENT: 57 year [place] lady with recent episodes of complex partial, simple partial and partial onset seizures with secondary generalization in June 2015 symptomatic from left frontal lesion etiology indeterminate. Adjacent venous angioma with  post ictal MRI changes from repetitive seizures. Seizures seem well controlled    PLAN: I had a long discussion with the patient with regards to her history of remote seizures and recommend she continue taking Keppra XR 500 mg daily as she is tolerating it well and not having any side effects.  I advised her to avoid seizure provoking stimuli like medication noncompliance, regular sleeping and eating habits and extremes of exertion.  I also explained to her that she will likely need to be on seizure medications long-term due to her abnormal MRI which puts her at increased risk for seizure recurrence.  She was given refill for Keppra for a year.  She will return for follow-up in the future in a year or call earlier if necessary.Greater than 50% time during this 30 minute visit was spent on counseling and coordination of care about seizures and prevention of recurrence. Delia Heady, MD    Note: This document was prepared with digital dictation and possible  smart phrase technology. Any transcriptional errors that result from this process are unintentional.

## 2021-10-10 NOTE — Patient Instructions (Addendum)
I had a long discussion with the patient with regards to her history of remote seizures and recommend she continue taking Keppra XR 500 mg daily as she is tolerating it well and not having any side effects.  I advised her to avoid seizure provoking stimuli like medication noncompliance, regular sleeping and eating habits and extremes of exertion.  I also explained to her that she will likely need to be on seizure medications long-term due to her abnormal MRI which puts her at increased risk for seizure recurrence.  She was given refill for Keppra for a year.  She will return for follow-up in the future in a year or call earlier if necessary.

## 2021-12-23 ENCOUNTER — Encounter: Payer: Self-pay | Admitting: *Deleted

## 2021-12-31 ENCOUNTER — Ambulatory Visit (INDEPENDENT_AMBULATORY_CARE_PROVIDER_SITE_OTHER): Payer: Commercial Managed Care - PPO | Admitting: Family Medicine

## 2021-12-31 ENCOUNTER — Encounter: Payer: Self-pay | Admitting: Family Medicine

## 2021-12-31 VITALS — BP 90/62 | HR 73 | Temp 98.3°F | Ht 64.0 in | Wt 140.2 lb

## 2021-12-31 DIAGNOSIS — K2270 Barrett's esophagus without dysplasia: Secondary | ICD-10-CM

## 2021-12-31 DIAGNOSIS — E538 Deficiency of other specified B group vitamins: Secondary | ICD-10-CM | POA: Diagnosis not present

## 2021-12-31 DIAGNOSIS — Z23 Encounter for immunization: Secondary | ICD-10-CM | POA: Diagnosis not present

## 2021-12-31 DIAGNOSIS — K2100 Gastro-esophageal reflux disease with esophagitis, without bleeding: Secondary | ICD-10-CM

## 2021-12-31 DIAGNOSIS — Z1231 Encounter for screening mammogram for malignant neoplasm of breast: Secondary | ICD-10-CM | POA: Diagnosis not present

## 2021-12-31 DIAGNOSIS — Z0000 Encounter for general adult medical examination without abnormal findings: Secondary | ICD-10-CM | POA: Diagnosis not present

## 2021-12-31 DIAGNOSIS — E559 Vitamin D deficiency, unspecified: Secondary | ICD-10-CM

## 2021-12-31 DIAGNOSIS — Z1211 Encounter for screening for malignant neoplasm of colon: Secondary | ICD-10-CM | POA: Diagnosis not present

## 2021-12-31 DIAGNOSIS — K146 Glossodynia: Secondary | ICD-10-CM

## 2021-12-31 DIAGNOSIS — Z1212 Encounter for screening for malignant neoplasm of rectum: Secondary | ICD-10-CM

## 2021-12-31 DIAGNOSIS — G40009 Localization-related (focal) (partial) idiopathic epilepsy and epileptic syndromes with seizures of localized onset, not intractable, without status epilepticus: Secondary | ICD-10-CM

## 2021-12-31 LAB — LIPID PANEL
Cholesterol: 181 mg/dL (ref 0–200)
HDL: 57.5 mg/dL (ref >39.00)
LDL Cholesterol: 115 mg/dL — ABNORMAL HIGH (ref 0–99)
NonHDL: 123.08
Total CHOL/HDL Ratio: 3
Triglycerides: 41 mg/dL (ref 0.0–149.0)
VLDL: 8.2 mg/dL (ref 0.0–40.0)

## 2021-12-31 LAB — CBC WITH DIFFERENTIAL/PLATELET
Basophils Absolute: 0 10*3/uL (ref 0.0–0.1)
Basophils Relative: 0.3 % (ref 0.0–3.0)
Eosinophils Absolute: 0 10*3/uL (ref 0.0–0.7)
Eosinophils Relative: 0.6 % (ref 0.0–5.0)
HCT: 36.7 % (ref 36.0–46.0)
Hemoglobin: 12.3 g/dL (ref 12.0–15.0)
Lymphocytes Relative: 39.1 % (ref 12.0–46.0)
Lymphs Abs: 2.3 10*3/uL (ref 0.7–4.0)
MCHC: 33.5 g/dL (ref 30.0–36.0)
MCV: 91.5 fl (ref 78.0–100.0)
Monocytes Absolute: 0.4 10*3/uL (ref 0.1–1.0)
Monocytes Relative: 6.8 % (ref 3.0–12.0)
Neutro Abs: 3.1 10*3/uL (ref 1.4–7.7)
Neutrophils Relative %: 53.2 % (ref 43.0–77.0)
Platelets: 239 10*3/uL (ref 150.0–400.0)
RBC: 4.01 Mil/uL (ref 3.87–5.11)
RDW: 13.1 % (ref 11.5–15.5)
WBC: 5.9 10*3/uL (ref 4.0–10.5)

## 2021-12-31 LAB — COMPREHENSIVE METABOLIC PANEL
ALT: 20 U/L (ref 0–35)
AST: 20 U/L (ref 0–37)
Albumin: 4.1 g/dL (ref 3.5–5.2)
Alkaline Phosphatase: 64 U/L (ref 39–117)
BUN: 11 mg/dL (ref 6–23)
CO2: 27 mEq/L (ref 19–32)
Calcium: 9.5 mg/dL (ref 8.4–10.5)
Chloride: 104 mEq/L (ref 96–112)
Creatinine, Ser: 0.74 mg/dL (ref 0.40–1.20)
GFR: 94.63 mL/min (ref >60.00)
Glucose, Bld: 86 mg/dL (ref 70–99)
Potassium: 3.9 mEq/L (ref 3.5–5.1)
Sodium: 138 mEq/L (ref 135–145)
Total Bilirubin: 0.6 mg/dL (ref 0.2–1.2)
Total Protein: 7.4 g/dL (ref 6.0–8.3)

## 2021-12-31 LAB — VITAMIN B12: Vitamin B-12: 829 pg/mL (ref 211–911)

## 2021-12-31 LAB — TSH: TSH: 2.58 u[IU]/mL (ref 0.35–5.50)

## 2021-12-31 LAB — VITAMIN D 25 HYDROXY (VIT D DEFICIENCY, FRACTURES): VITD: 29.05 ng/mL — ABNORMAL LOW (ref 30.00–100.00)

## 2021-12-31 NOTE — Patient Instructions (Signed)
Please return in 12 months for your annual complete physical; please come fasting.   I will release your lab results to you on your MyChart account with further instructions. You may see the results before I do, but when I review them I will send you a message with my report or have my assistant call you if things need to be discussed. Please reply to my message with any questions. Thank you!   If you have any questions or concerns, please don't hesitate to send me a message via MyChart or call the office at [phone]. Thank you for visiting with [place] today! It's our pleasure caring for you.   I have ordered a mammogram and/or bone density for you as we discussed today: [x]   Mammogram  []   Bone Density  Please call the office checked below to schedule your appointment:  [x]   [facility]      [place], [place]         []   [facility]  [place], [place]  [place] has several female physicians. Let me know if you decide to switch practices and I can place the referral OR call Dr. Jonna Munro office and get scheduled for a colonoscopy.

## 2021-12-31 NOTE — Progress Notes (Signed)
Subjective  Chief Complaint  Patient presents with   Annual Exam    Pt here for Annual exam and is currently fasting     HPI: Andrea Berg is a 51 y.o. female who presents to [facility] [place] today for a Female Wellness Visit. She also has the concerns and/or needs as listed above in the chief complaint. These will be addressed in addition to the Health Maintenance Visit.   Wellness Visit: annual visit with health maintenance review and exam without Pap  Health maintenance: Overdue for mammogram.  Pap smear up-to-date..  She does see a GI doctor in [place] but is considering switching because of the distance.  See below.  Average risk for colon cancer.  Flu shot today.  Overall doing well.  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  50 year old and 31 year old daughters.  Home life is good. Chronic disease f/u and/or acute problem visit: (deemed necessary to be done in addition to the wellness visit): Multiple chronic problems including Barrett's esophagus, GERD, vitamin D and B12 deficiencies, seizure disorder and burning mouth syndrome.  Reviewed all.  Reviewed recent GI notes.  Had upper endoscopy which is stable.  Currently on famotidine only.  Feels that omeprazole is making things worse.  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  Still active.  Needs follow-up.  Vitamin deficiencies.  Reviewed neurology notes and seizure disorder is stable.  Assessment  1. Annual physical exam   2. Need for immunization against influenza   3. Encounter for screening mammogram for breast cancer   4. Screening for colorectal cancer   5. Barrett's esophagus without dysplasia   6. Vitamin D deficiency   7. Vitamin B12 deficiency   8. Gastroesophageal reflux disease with esophagitis without hemorrhage   9. Partial idiopathic epilepsy with seizures of localized onset, not intractable, without status epilepticus (HCC)   10. Burning mouth syndrome      Plan   Female Wellness Visit: Age appropriate Health Maintenance and Prevention measures were discussed with patient. Included topics are cancer screening recommendations, ways to keep healthy (see AVS) including dietary and exercise recommendations, regular eye and dental care, use of seat belts, and avoidance of moderate alcohol use and tobacco use.  Recommend mammogram.  Patient to schedule.  Discussed colonoscopy.  She will decide whether she wants to have it in [place] with her current GI doctor or switch to [facility]. BMI: discussed patient's BMI and encouraged positive lifestyle modifications to help get to or maintain a target BMI. HM needs and immunizations were addressed and ordered. See below for orders. See HM and immunization section for updates.  Flu shot today Routine labs and screening tests ordered including cmp, cbc and lipids where appropriate. Discussed recommendations regarding Vit D and calcium supplementation (see AVS)  Chronic disease management visit and/or acute problem visit: GERD and Barrett's esophagus history of biliary sludge and biliary colic: Follow-up with GI.  Continue famotidine 20 mg daily.  She will have a discussion with her GI whether she needs to restart a PPI.  Endoscopies are current Recheck vitamin levels.  She does take supplements, D labs and daily and B12 1000 daily Continue Keppra per neurology  Follow up: 12 months for complete physical Orders Placed This Encounter  Procedures   MM DIGITAL SCREENING BILATERAL   Flu Vaccine QUAD 74mo+IM (Fluarix, Fluzone & Alfiuria Quad PF)   CBC with Differential/Platelet   Comprehensive metabolic panel   Lipid panel   TSH   Vitamin  B12   VITAMIN D 25 Hydroxy (Vit-D Deficiency, Fractures)   No orders of the defined types were placed in this encounter.     Body mass index is 24.07 kg/m. Wt Readings from Last 3 Encounters:  12/31/21 140 lb 3.2 oz (63.6 kg)  10/10/21 140 lb (63.5 kg)  12/24/20 137 lb 9.6  oz (62.4 kg)     Patient Active Problem List   Diagnosis Date Noted   Vitamin D deficiency 12/27/2020   Vitamin B12 deficiency 12/27/2020   Burning mouth syndrome 08/10/2019   Barrett's esophagus without dysplasia 08/10/2019   Biliary colic symptom 58/52/7782   Biliary sludge 08/10/2019   Gastroesophageal reflux disease with esophagitis without hemorrhage 08/10/2019   Epilepsy (HCC) 10/19/2013   Other specified congenital anomalies of brain 10/19/2013   Health Maintenance  Topic Date Due   COLONOSCOPY (Pts 45-12yrs Insurance coverage will need to be confirmed)  Never done   MAMMOGRAM  01/24/2021   COVID-19 Vaccine (3 - Pfizer series) 01/16/2022 (Originally 08/19/2019)   PAP SMEAR-Modifier  12/29/2022   TETANUS/TDAP  02/06/2026   INFLUENZA VACCINE  Completed   Hepatitis C Screening  Completed   HIV Screening  Completed   HPV VACCINES  Aged Out   Immunization History  Administered Date(s) Administered   Influenza,inj,Quad PF,6+ Mos 12/28/2017, 12/16/2019, 12/24/2020, 12/31/2021   Influenza-Unspecified 04/20/2014   PFIZER(Purple Top)SARS-COV-2 Vaccination 06/03/2019, 06/24/2019   PPD Test 02/14/2015   Td 01/10/2016, 02/07/2016   Tdap 06/18/2010   We updated and reviewed the patient's past history in detail and it is documented below. Allergies: Patient is allergic to levofloxacin and nexium [esomeprazole]. Past Medical History Patient  has a past medical history of GERD (gastroesophageal reflux disease), Hyperactive, Lower half migraine, and Seizures (HCC). Past Surgical History Patient  has no past surgical history on file. Family History: Patient family history includes Heart attack in her father. Social History:  Patient  reports that she has never smoked. She has never used smokeless tobacco. She reports that she does not drink alcohol and does not use drugs.  Review of Systems: Constitutional: negative for fever or malaise Ophthalmic: negative for photophobia,  double vision or loss of vision Cardiovascular: negative for chest pain, dyspnea on exertion, or new LE swelling Respiratory: negative for SOB or persistent cough Gastrointestinal: negative for abdominal pain, change in bowel habits or melena Genitourinary: negative for dysuria or gross hematuria, no abnormal uterine bleeding or disharge Musculoskeletal: negative for new gait disturbance or muscular weakness Integumentary: negative for new or persistent rashes, no breast lumps Neurological: negative for TIA or stroke symptoms Psychiatric: negative for SI or delusions Allergic/Immunologic: negative for hives  Patient Care Team    Relationship Specialty Notifications Start End  Willow Ora, MD PCP - General Family Medicine  08/10/19   Carla Drape Consulting Physician Gastroenterology  08/10/19   Mamie Laurel, MD Referring Physician General Surgery  08/10/19     Objective  Vitals: BP 90/62   Pulse 73   Temp 98.3 F (36.8 C)   Ht 5\' 4"  (1.626 m)   Wt 140 lb 3.2 oz (63.6 kg)   SpO2 98%   BMI 24.07 kg/m  General:  Well developed, well nourished, no acute distress  Psych:  Alert and orientedx3,normal mood and affect HEENT:  Normocephalic, atraumatic, non-icteric sclera,  supple neck without adenopathy, mass or thyromegaly Cardiovascular:  Normal S1, S2, RRR without gallop, rub or murmur Respiratory:  Good breath sounds bilaterally, CTAB with normal respiratory effort Gastrointestinal:  normal bowel sounds, soft, non-tender, no noted masses. No HSM MSK: no deformities, contusions. Joints are without erythema or swelling.  Skin:  Warm, no rashes or suspicious lesions noted Neurologic:    Mental status is normal.Gross motor and sensory exams are normal. Normal gait. No tremor   Commons side effects, risks, benefits, and alternatives for medications and treatment plan prescribed today were discussed, and the patient expressed understanding of the given instructions. Patient is  instructed to call or message via MyChart if he/she has any questions or concerns regarding our treatment plan. No barriers to understanding were identified. We discussed Red Flag symptoms and signs in detail. Patient expressed understanding regarding what to do in case of urgent or emergency type symptoms.  Medication list was reconciled, printed and provided to the patient in AVS. Patient instructions and summary information was reviewed with the patient as documented in the AVS. This note was prepared with assistance of Dragon voice recognition software. Occasional wrong-word or sound-a-like substitutions may have occurred due to the inherent limitations of voice recognition software

## 2022-01-17 ENCOUNTER — Telehealth: Payer: Self-pay

## 2022-01-17 NOTE — Telephone Encounter (Signed)
Spoke with pt regarding lab results/recommendations 

## 2022-03-05 IMAGING — MG DIGITAL SCREENING BILAT W/ TOMO W/ CAD
8 series · 9 of 24 positions shown · non-contrast
Comparison: None.

CLINICAL DATA: Screening.

EXAM:
DIGITAL SCREENING BILATERAL MAMMOGRAM WITH TOMO AND CAD

[R CC synth-2D]
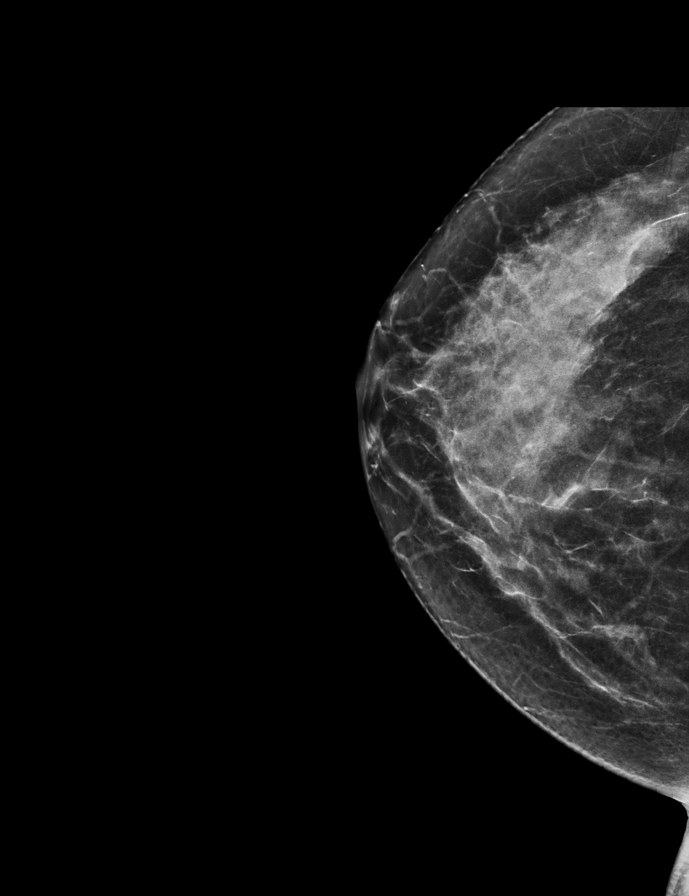

[L CC synth-2D]
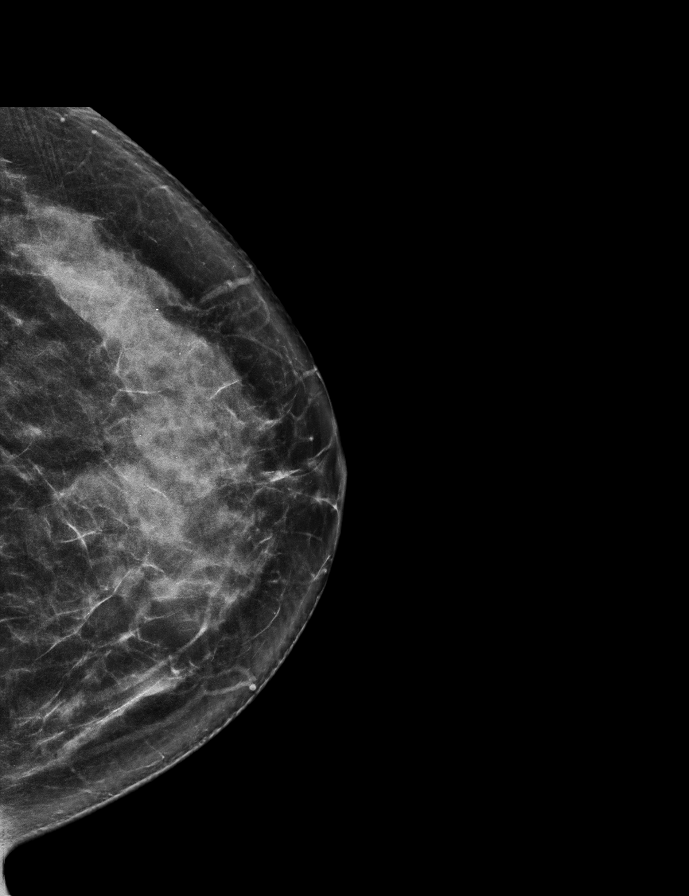

[L MLO synth-2D]
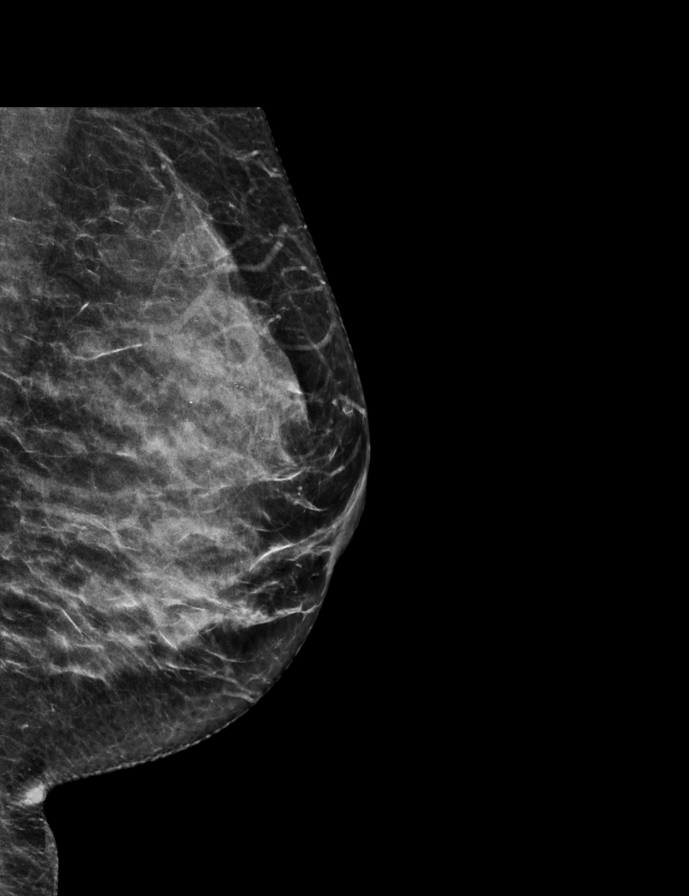

[R MLO synth-2D]
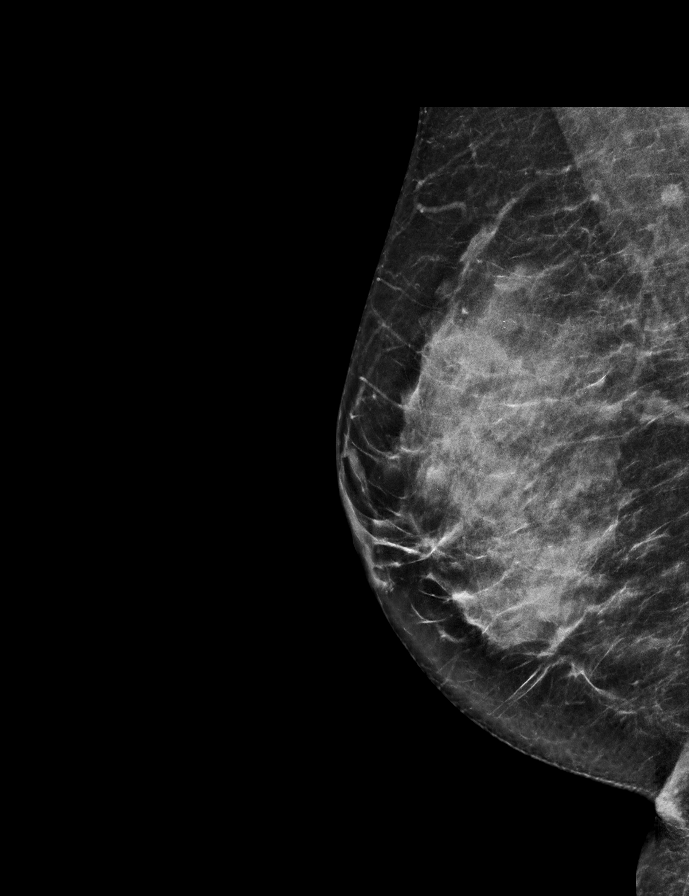

[L MLO tomo · 2 of 64 frames shown]
[frame 21/64]
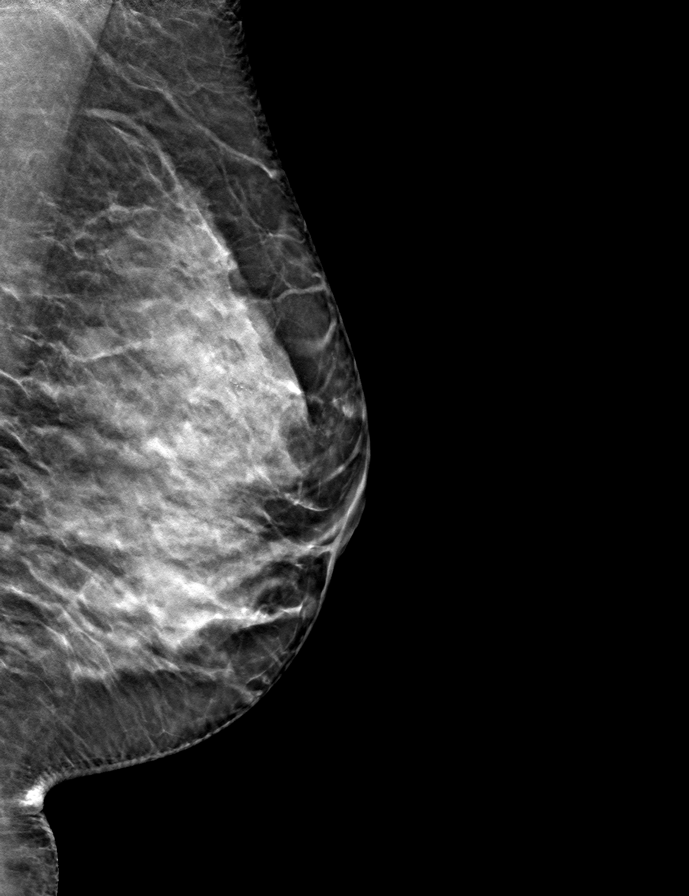
[frame 33/64]
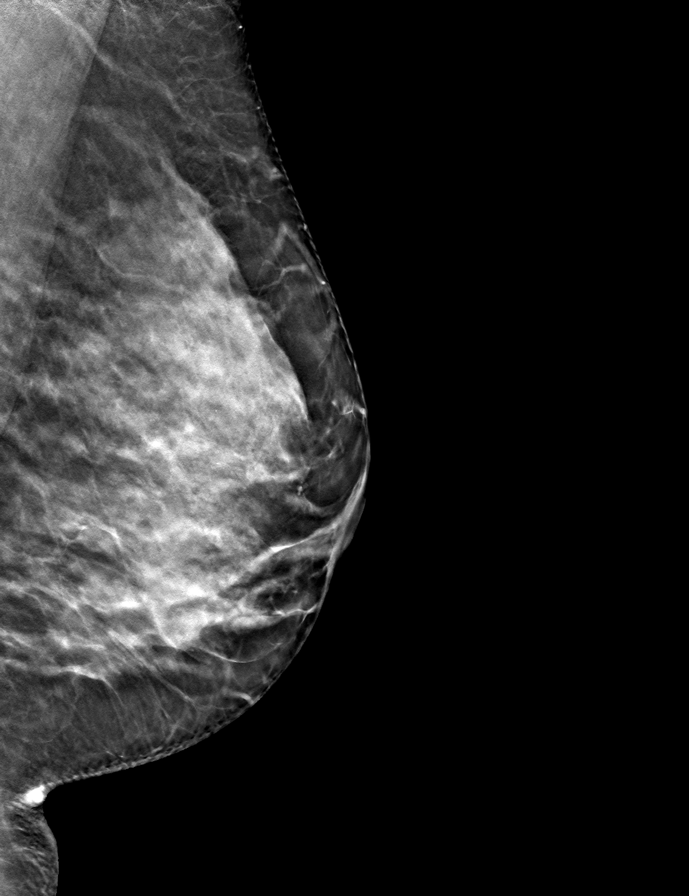

[R MLO tomo · tomo slice 33/65.0]
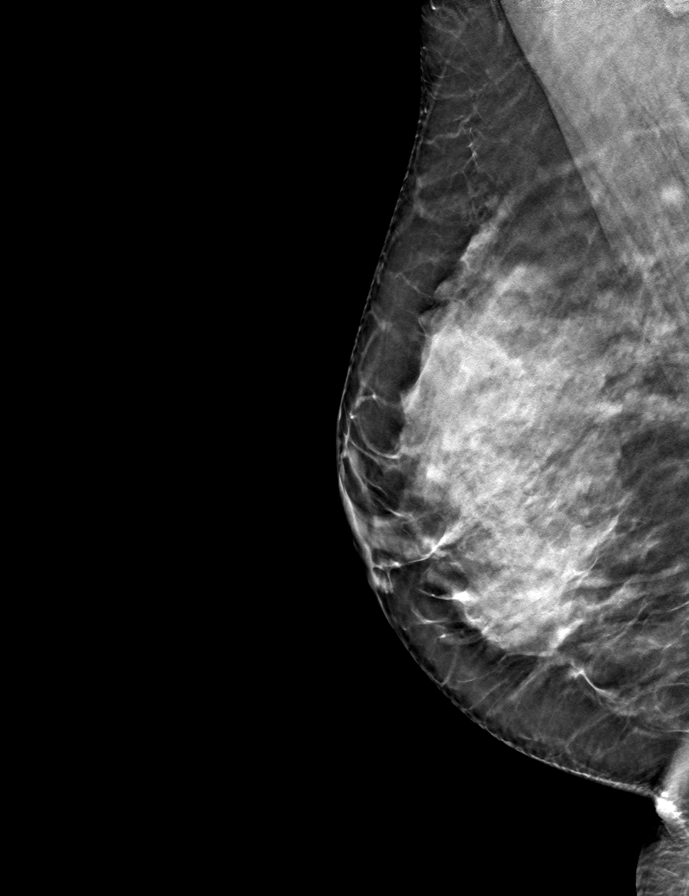

[L CC tomo · tomo slice 35/69.0]
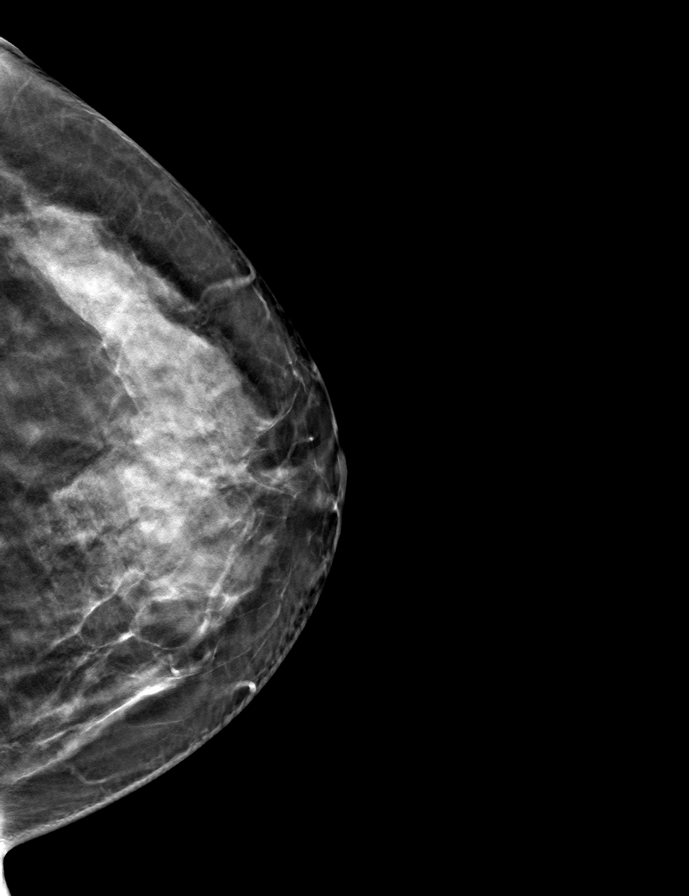

[R CC tomo · tomo slice 32/63.0]
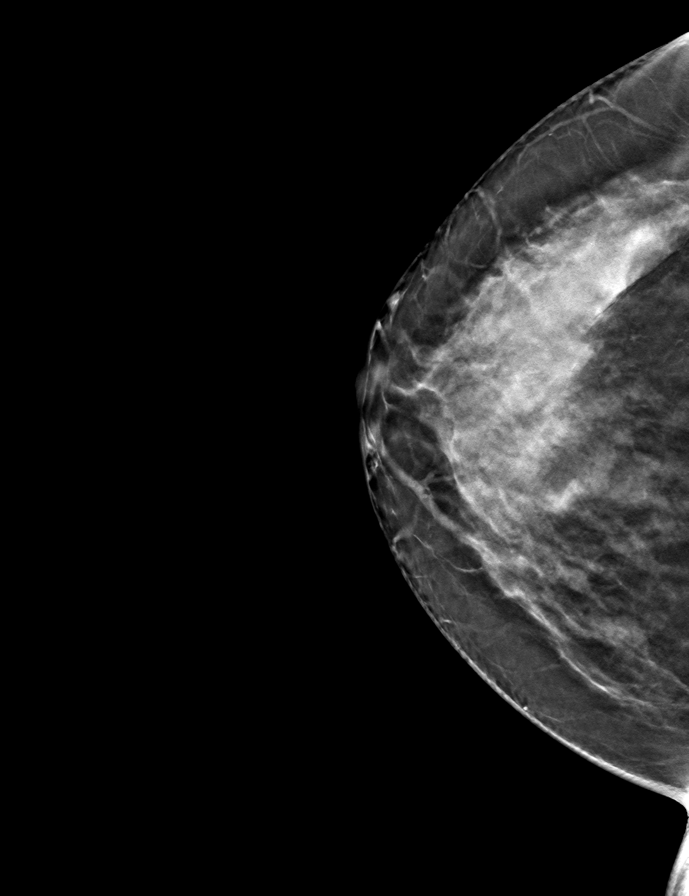

[9 of 24 positions shown; findings below may reference images not displayed]

ACR Breast Density Category d: The breast tissue is extremely dense,
which lowers the sensitivity of mammography.
FINDINGS: In the left breast, calcifications warrant further evaluation. In
the right breast, no findings suspicious for malignancy. Images were
processed with CAD.
IMPRESSION: Further evaluation is suggested for calcifications in the left
breast.

RECOMMENDATION:
Diagnostic mammogram of the left breast. (Code:[id number])

The patient will be contacted regarding the findings, and additional
imaging will be scheduled.

BI-RADS CATEGORY  0: Incomplete. Need additional imaging evaluation
and/or prior mammograms for comparison.

## 2022-03-10 ENCOUNTER — Ambulatory Visit: Payer: Commercial Managed Care - PPO

## 2022-03-26 IMAGING — MG DIGITAL DIAGNOSTIC UNILAT LEFT W/ CAD
3 series · 3 of 3 positions shown · non-contrast
Comparison: Previous exam(s).

CLINICAL DATA: Patient was recalled from screening mammogram for
left breast calcifications.

EXAM:
DIGITAL DIAGNOSTIC LEFT MAMMOGRAM WITH CAD

[L ML (1 of 2)]
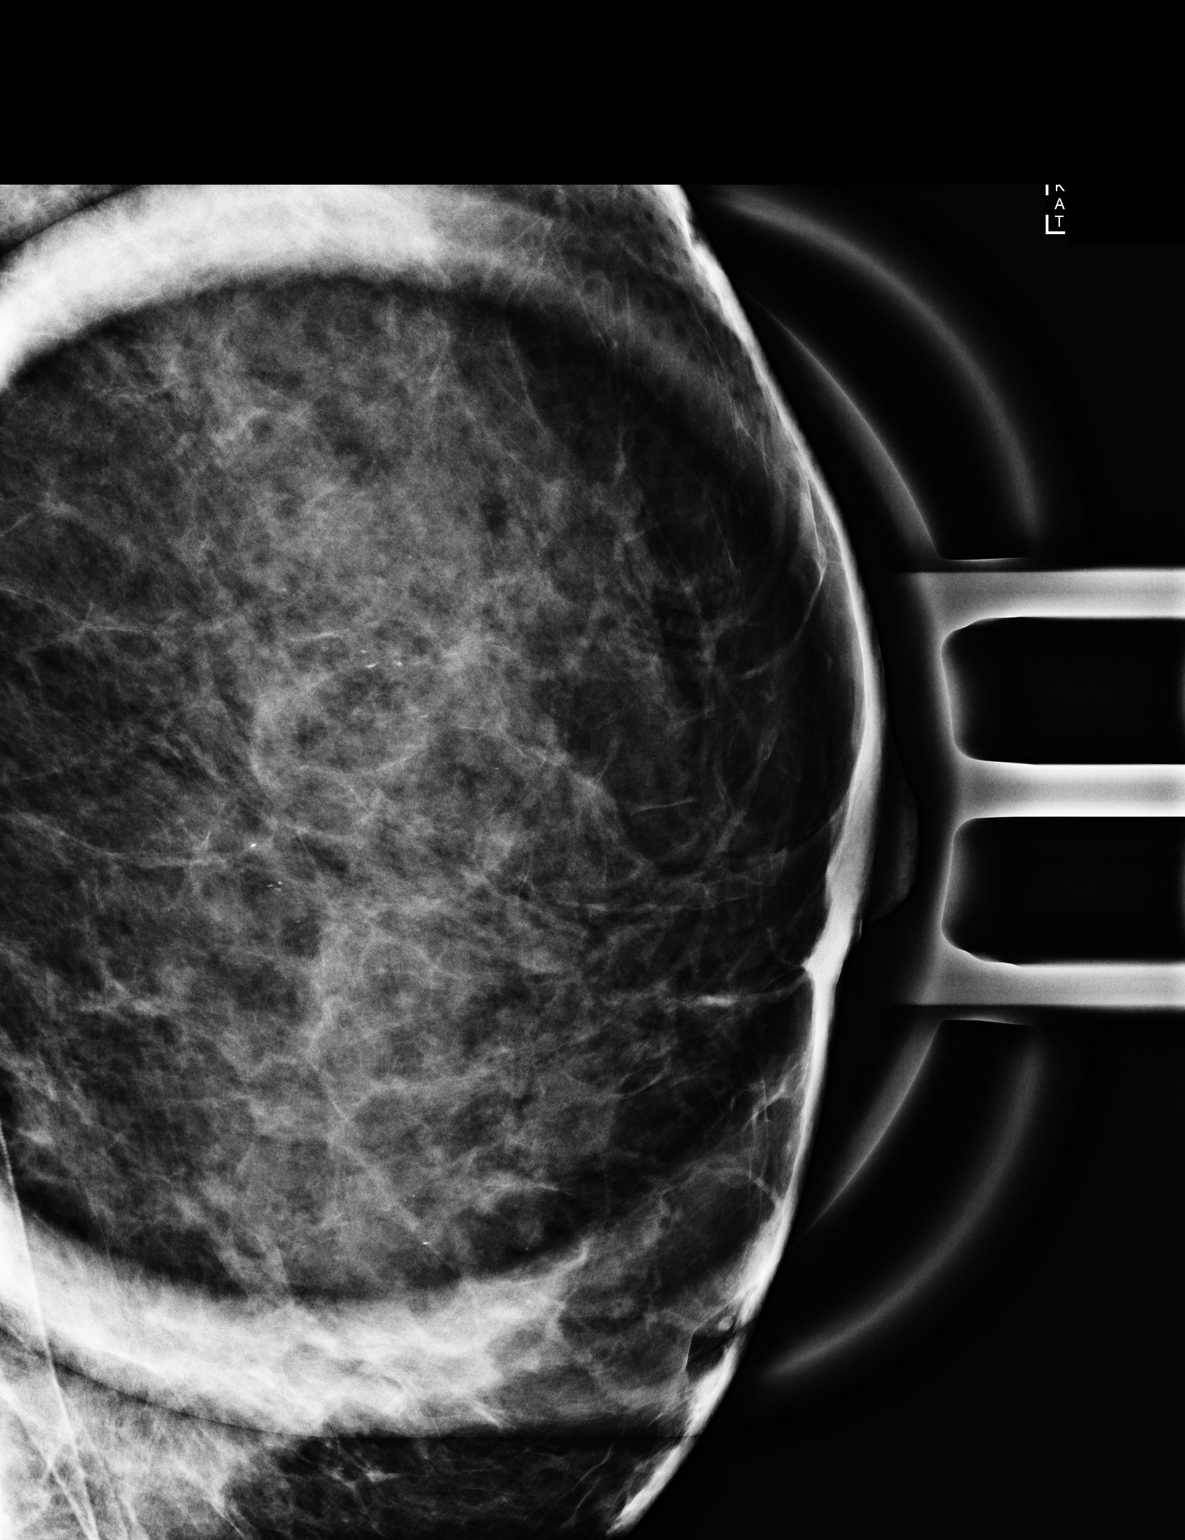

[L ML (2 of 2)]
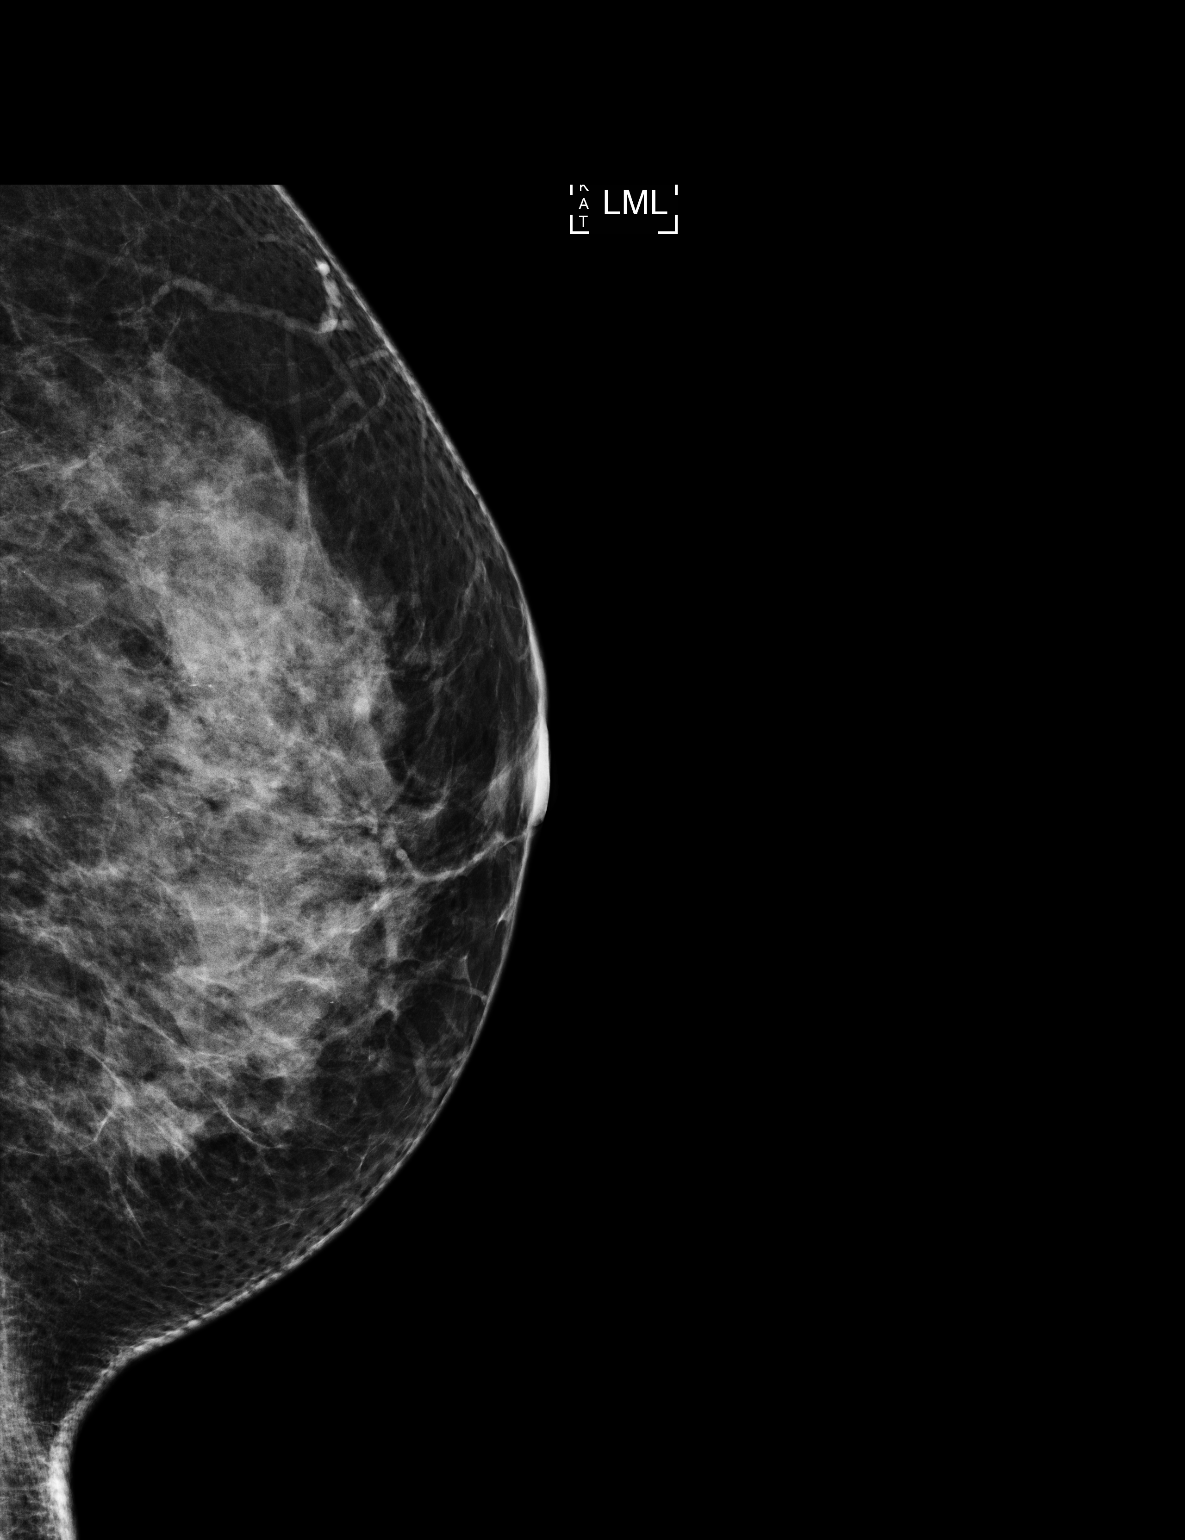

[L CC]
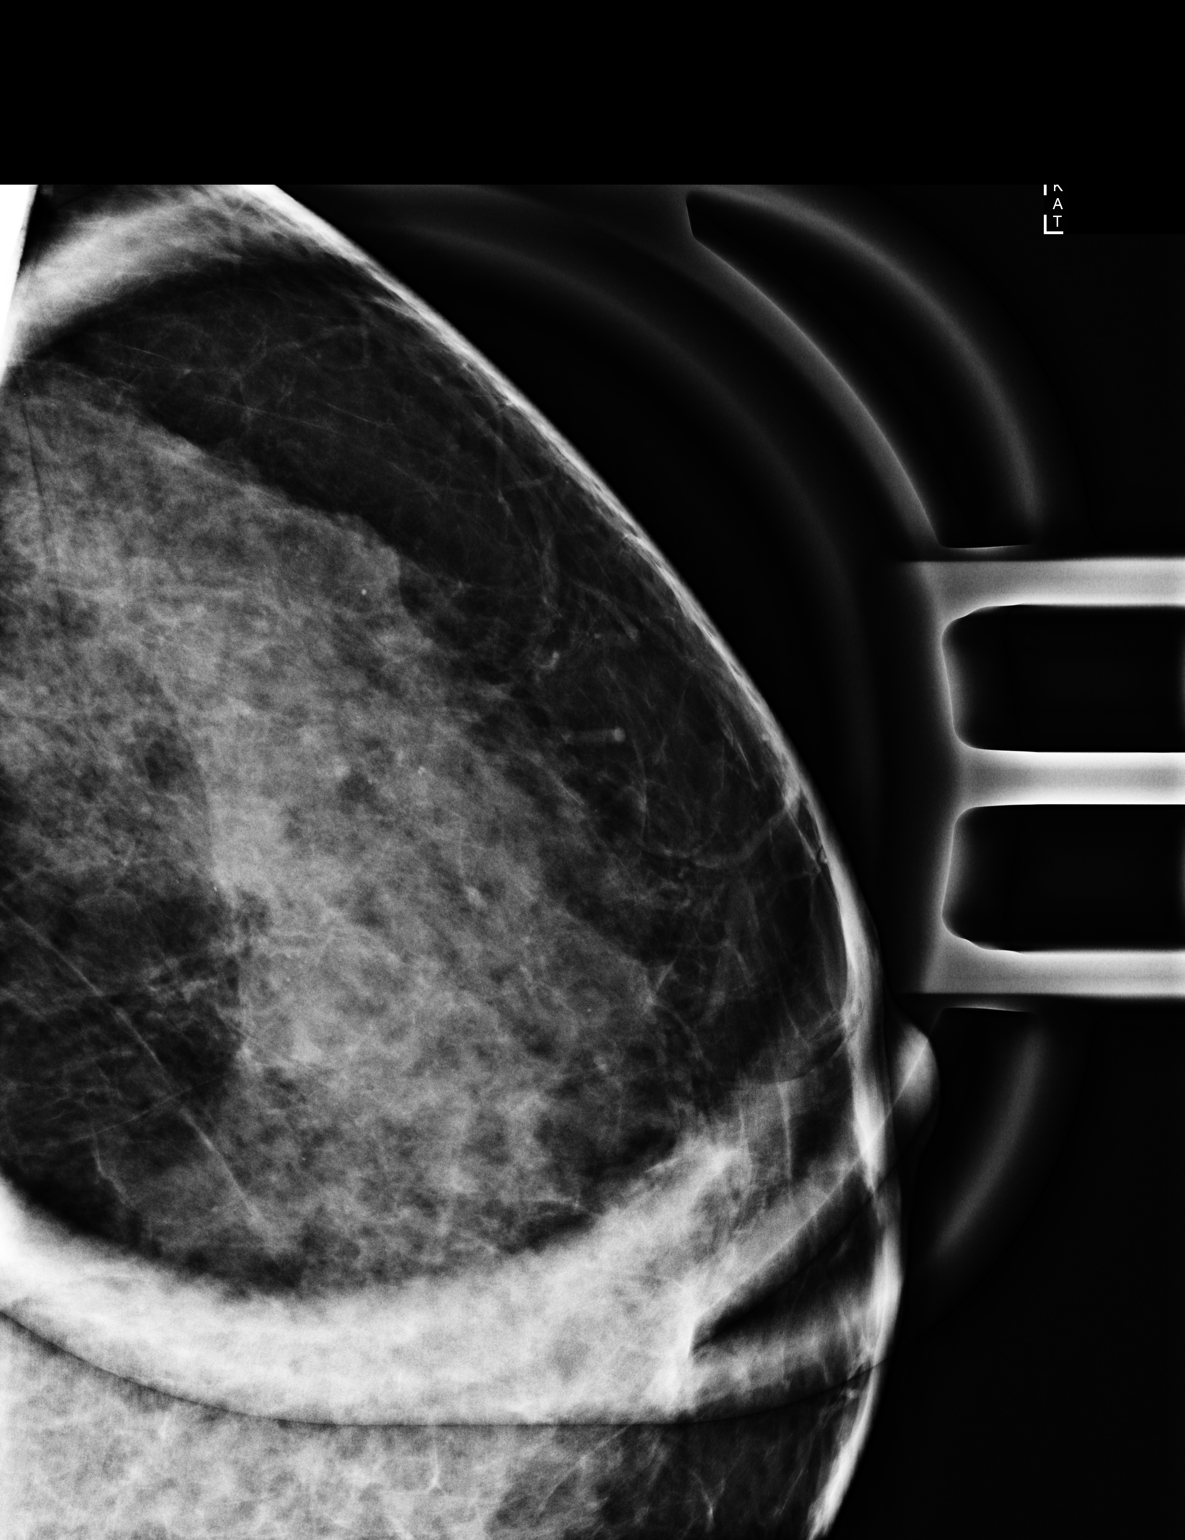

[3 of 3 positions shown; findings below may reference images not displayed]

ACR Breast Density Category c: The breast tissue is heterogeneously
dense, which may obscure small masses.
FINDINGS: Additional imaging of the left breast was performed. There are
diffuse calcifications in the upper-outer quadrant of the breast
that have a differential appearance consistent with milk of calcium.
No suspicious calcifications identified.

Mammographic images were processed with CAD.
IMPRESSION: No evidence of malignancy in the left breast.

RECOMMENDATION:
Bilateral screening mammogram in 1 year is recommended.

I have discussed the findings and recommendations with the patient.
If applicable, a reminder letter will be sent to the patient
regarding the next appointment.

BI-RADS CATEGORY  2: Benign.

## 2022-04-28 ENCOUNTER — Ambulatory Visit (INDEPENDENT_AMBULATORY_CARE_PROVIDER_SITE_OTHER): Payer: Commercial Managed Care - PPO | Admitting: Family Medicine

## 2022-04-28 ENCOUNTER — Telehealth: Payer: Self-pay | Admitting: Family Medicine

## 2022-04-28 ENCOUNTER — Encounter: Payer: Self-pay | Admitting: Family Medicine

## 2022-04-28 VITALS — BP 100/60 | HR 80 | Temp 97.9°F | Ht 64.0 in | Wt 141.2 lb

## 2022-04-28 DIAGNOSIS — R051 Acute cough: Secondary | ICD-10-CM

## 2022-04-28 DIAGNOSIS — J069 Acute upper respiratory infection, unspecified: Secondary | ICD-10-CM

## 2022-04-28 LAB — POCT RAPID STREP A (OFFICE): Rapid Strep A Screen: NEGATIVE

## 2022-04-28 LAB — POCT INFLUENZA A/B: Influenza A, POC: NEGATIVE

## 2022-04-28 LAB — POC COVID19 BINAXNOW: SARS Coronavirus 2 Ag: NEGATIVE

## 2022-04-28 NOTE — Progress Notes (Signed)
Subjective   CC:  Chief Complaint  Patient presents with   Cough    Bodyaches, low grade fever, congestion, slight cough and sore throat    HPI: Andrea Berg is a 52 y.o. female who presents to the office today to address the problems listed above in the chief complaint. Patient complains of typical URI symptoms including nasal congestion, mild sore throat, cough, and mild malaise.  The symptoms have been present for 3-4 days. Had strep throat treated with pcn 1/14 at an urgent care. . She denies high fever or productive cough, shortness of breath or significant GI symptoms.  Over-the-counter cold medicines have been minimally or mildly helpful.  Assessment  1. Acute cough   2. Viral URI with cough      Plan  URI, viral: discussed dx; no sign or sx of bacterial infection is present. Treat supportively with antihistamines, decongestants, and/or cough meds. See AVS for care instructions.   Follow up: No follow-ups on file.   Orders Placed This Encounter  Procedures   POCT Influenza A/B   POCT rapid strep A   POC COVID-19   No orders of the defined types were placed in this encounter.     I reviewed the patients updated PMH, FH, and SocHx.    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   famotidine (PEPCID) 40 MG tablet Take by mouth.   ibuprofen (ADVIL,MOTRIN) 100 MG tablet Take 100 mg by mouth every 6 (six) hours as needed for fever.   levETIRAcetam  (KEPPRA XR) 500 MG 24 hr tablet Take 1 tablet (500 mg total) by mouth daily.    Review of Systems: Constitutional: Negative for fever malaise or anorexia Cardiovascular: negative for chest pain Respiratory: negative for SOB or pleuritic chest pain Gastrointestinal: negative for abdominal pain  Objective  Vitals: BP 100/60   Pulse 80   Temp 97.9 F (36.6 C)   Ht 5\' 4"  (1.626 m)   Wt 141 lb 3.2 oz (64 kg)   SpO2 98%   BMI 24.24 kg/m  General: no acute respiratory distress  Psych:  Alert and oriented, normal mood and affect HEENT: Normocephalic, nasal congestion present, TMs w/o erythema, OP with erythema w/o exudate, + cervical LAD, supple neck  Cardiovascular:  RRR without murmur or gallop. no peripheral edema Respiratory:  Good breath sounds bilaterally, CTAB with normal respiratory effort  Office Visit on 04/28/2022  Component Date Value Ref Range Status   Influenza A, POC 04/28/2022 Negative  Negative Final   Rapid Strep A Screen 04/28/2022 Negative  Negative Final   SARS Coronavirus 2 Ag 04/28/2022 Negative  Negative Final    Commons side effects, risks, benefits, and alternatives for medications and treatment plan prescribed today were discussed, and the patient expressed understanding of the given instructions. Patient is instructed to call or message via MyChart if he/she has any questions or concerns regarding our treatment plan. No barriers to understanding were identified. We discussed Red Flag symptoms and signs  in detail. Patient expressed understanding regarding what to do in case of urgent or emergency type symptoms.  Medication list was reconciled, printed and provided to the patient in AVS. Patient instructions and summary information was reviewed with the patient as documented in the AVS. This note was prepared with assistance of Dragon voice recognition software. Occasional wrong-word or sound-a-like substitutions may have occurred due to the inherent limitations of  voice recognition software

## 2022-04-28 NOTE — Patient Instructions (Signed)
Please follow up if symptoms do not improve or as needed.    What is an Upper Respiratory Infection? An Upper Respiratory Infection (URI), sometimes called a "cold," occurs when viruses attack the nose, throat and chest. A URI usually runs its course in 2-14 days, although most people feel better in about a week.  Do I need antibiotics? What if my mucus is green? When germs that cause colds first infect the nose and sinuses, the nose makes clear mucus. This helps wash the germs from the nose and sinuses. After two or three days, the body's immune cells fight back, changing the mucus to a white or yellow color. As the bacteria that live in the nose grow back, they may also be found in the mucus, which changes the mucus to a greenish color. This is normal and does not mean you or your child needs antibiotics.  Antibiotics are needed only if your healthcare provider tells you that you have a bacterial infection. Your healthcare provider may prescribe other medicine or give tips to help with a cold's symptoms, but antibiotics are not needed to treat a cold or runny nose.  How do I prevent spreading a URI to others? To prevent spreading to others, try the following: ? Stay at home while you are sick ? Avoid close contact with others, such as hugging, kissing, or shaking hands ? Move away from people before coughing or sneezing ? Cough and sneeze into a tissue then throw it away, or cough and sneeze into your upper shirt sleeve, completely covering your mouth and nose ? Wash your hands after coughing, sneezing, or blowing your nose ? Disinfect frequently touched surfaces, and objects such as toys and doorknobs  How is a URI treated? There is no cure for the common cold. For relief, try ? Getting plenty of rest  ? Drinking fluids ? Gargling with warm salt water and using cough drops or throat sprays ? Taking over-the-counter pain or cold medicines. However: o Do not give aspirin to children. And do  not give cough medicine to children under four.  o Over-the-counter medicines may help ease symptoms but will not make your cold go away faster. o ALWAYS read the label and use medications as directed.  What if I don't feel better? Follow up with your primary care provider if: ? Unusually severe cold symptoms or symptoms that last more than 10-14 days ? High fever (greater than 100.4F) ? Ear pain or sinus type headache ? Cough that gets worse while other cold symptoms improve ? Flare-up of any chronic lung problem, such as asthma ? If you develop any of the following you should go to the nearest urgent care center or emergency room: o shortness of breath, pain or pressure in the chest, high fever that doesn't get better with fever reducing medicine, confusion, fainting, severe vomiting, and/or severe facial pain.  For more information, visit: ? U.S. National Library of Medicine: http://www.nlm.nih.gov/medlineplus/ ? Centers for Disease Control: http://www.cdc.gov/getsmart/antibiotic-use/uri/  Sources: http://www.nlm.nih.gov/medlineplus/commoncold.html and http://www.cdc.gov/getsmart/antibiotic-use/URI/colds.html    

## 2022-04-28 NOTE — Telephone Encounter (Signed)
Patient was not able to be reached via access nurse. Pt has an OV with PCP on 04/28/22.  Patient Name: Andrea Berg AS Gender: Female DOB: 08/22/1970 Age: 52 Y 3 M 19 D Return Phone Number: [phone] (Secondary) Address: City/ State/ Zip: [place]  [address] Client [facility] Client Site [facility] [place] Night Provider Billey Chang- MD Contact Type Call Who Is Calling Patient / Member / Family / Caregiver Call Type Triage / Clinical Relationship To Patient Self Return Phone Number [phone] (Secondary) Chief Complaint Sore Throat Reason for Call Symptomatic / Request for [name] states she has strep throat and not able to swallow medication not working. Translation No Disp. Time Eilene Ghazi Time) Disposition Final User 04/27/2022 11:33:15 AM Attempt made - message left Schreffler, RN, [name] 04/27/2022 12:07:30 PM Attempt made - message left Schreffler, RN, [name] 04/27/2022 12:17:40 PM FINAL ATTEMPT MADE - message left [name], RN, [name] Final Disposition 04/27/2022 12:17:40 PM FINAL ATTEMPT MADE - message left

## 2022-05-02 ENCOUNTER — Telehealth: Payer: Self-pay | Admitting: Family Medicine

## 2022-05-02 NOTE — Telephone Encounter (Signed)
Patient states she saw pcp on 04/28/22 but forgot to ask if she would benefit from the pnuemonia vaccination. Please Advise.

## 2022-05-05 NOTE — Telephone Encounter (Signed)
Message sent to pt thru MyChart on vaccines needed.

## 2022-05-09 ENCOUNTER — Ambulatory Visit
Admission: RE | Admit: 2022-05-09 | Discharge: 2022-05-09 | Disposition: A | Discharge status: Home / Self Care | Payer: Commercial Managed Care - PPO | Source: Ambulatory Visit | Attending: Family Medicine | Admitting: Family Medicine

## 2022-05-09 DIAGNOSIS — Z1231 Encounter for screening mammogram for malignant neoplasm of breast: Secondary | ICD-10-CM

## 2022-10-23 ENCOUNTER — Ambulatory Visit (INDEPENDENT_AMBULATORY_CARE_PROVIDER_SITE_OTHER): Payer: Commercial Managed Care - PPO | Admitting: Neurology

## 2022-10-23 ENCOUNTER — Encounter: Payer: Self-pay | Admitting: Neurology

## 2022-10-23 VITALS — BP 92/61 | HR 68 | Ht 64.0 in | Wt 142.0 lb

## 2022-10-23 DIAGNOSIS — G40009 Localization-related (focal) (partial) idiopathic epilepsy and epileptic syndromes with seizures of localized onset, not intractable, without status epilepticus: Secondary | ICD-10-CM | POA: Diagnosis not present

## 2022-10-23 MED ORDER — LEVETIRACETAM ER 500 MG PO TB24: 500.0000 mg | ORAL_TABLET | Freq: Every day | ORAL | 3 refills | Status: DC

## 2022-10-23 NOTE — Progress Notes (Signed)
[facility] [address] [place]. [place] [address] [phone]       OFFICE FOLLOW UP VISIT NOTE  [name] Date of Birth:  [birth year] Medical Record Number:  [id number]   Referring MD:  Farris Has  Reason for Referral:  seizures  HPI: 65 year [place] lady who recently returned from [place] where she had  hospitalization for a repetitive seizures and abnormal MRI scan of the brain. She is accompanied today by husband and has brought with her extensive hospital records, several MRI scans and lab results which I have  personally reviewed. She started feeling sick   actually the first week of June 2015 several days prior to going to [place]. She was felt to have a sinus infection and treated with Augmentin for sinusitis and Zofran for nausea. She felt tired after this and the day prior to her trip her husband noted that she looked pale and had head was turned to one side and she'll not fully responsive. EMS were called and patient vomited. She was taken to the long emergency room where she was felt to have near syncope and dehydration. She was treated with IV fluids. CT scan of the head, x-rays EKG and lab work were unremarkable. She was discharged on and felt better next morning after taking Sudafed. She traveled to [place] up with felt unwell. She started hearing voices in her head which were talking to each other. She is more able to concentrate and spent time with relatives. She felt better after she rested. Several days later after she had stayed up late in the night the husband noticed that she will have a generalized clonic tonic seizure. She was noted as morning with tossing and turning with teeth clenching. She was taken to the  [facility] hospital where she was diagnosed having seizures and treated with diazepam. She fell asleep. Next-day she was asked to see a neurologist who advised an MRI scan and EEG. She started having speech difficulties and  stopping  midsentence   and unable to complete sentences. The patient's husband [name] L4 medial which have looked at in which patient is making vocalizations but not able to form words. Patient was subsequently hospitalized and underwent a spinal tap which was reportedly normal and an MRI scan of the brain which showed weak diffusion positive lesion in the left medial frontal lobe cingulate gyrus. There were no ADC map images.This lesion was also seen on T2 and flair images and   a postcontrast MRI showed a venous angioma adjacent to this lesion. Lesion was felt to represent cortical dysplasia versus postictal changes from repititive seizures.. The patient was initially started on Trileptal and phenytoin but continued to have auditory hallucinations and some speech and word finding difficulties but eventually she was switched to Keppra and she had a miraculous response to this and voices in her head stopped and her speech improved and she's not had any further seizures since then . She however has had some paranoid ideation and is having thoughts of fears of separation from her family and thoughts like ironing clothes we'll start a fire. She is currently taking Keppra 500 twice daily and Trileptal 300 twice daily. She had lab work done on 10/18/13 which showed low sodium of 133 but normal WBC and electrolytes and liver functions otherwise. Update 12/30/2013 : She returns for followup after last visit 3 months ago. She is doing well and has not had any recurrent seizures. She has tapered and  discontinued Trileptal. She is currently on Keppra 500 twice daily. She does complain of some excessive sleepiness and tiredness. She is continues to have mild short-term memory and cognitive difficulties but some of these 3 seated her seizures. Husband is concerned that at times she forgets recent conversations and events. She had EEG done which I have personally reviewed and was normal. A followup MRI scan of the brain done on 12/08/13  personally reviewed shows left frontal small venous angioma without evidence of significant hemorrhage. The left cingulate gyrus swelling and cytotoxic edema have completely resolved compared with previous MRI from June 2015 from [place] Update 08/11/2014 : She returns for follow-up after last visit 7 months ago. She continues to do well without any recurrent seizures. She has been quite compliant with Keppra which she is taking regularly. She is tolerating it well without significant side effects. She does complain of slight fatigue as well as palpitations but this may be related to underlying anxiety and some element of posttraumatic stress. She has not been participating in any regular activities for stress relaxation. Update 02/21/2015 : She returns for follow-up after last visit 6 months ago. She is accompanied by husband. She continues to do well without recurrent seizures. She has been compliant with Keppra which she is taking regularly and tolerating it quite well. She continues to have slight fatigue as well as anxiety. She has not been participating in regular activities for stress laxation. She has questions about how long she needs to stay on Keppra and I spent a lot of time counseling patient and her husband the need for long-term anticonvulsants given the fact she had multiple seizures Update 08/20/2015 : She returns for follow-up after last visit 6 months ago. She is accompanied by her husband. She is doing well and has not had recurrent seizures now for nearly 2 years. The she is tolerating Keppra 500 mg twice daily quite well without any side effects. Did have a transient episode of pain in the left hand and does have occasional knee and joint pains but otherwise is doing quite well. She wants to lose weight but has not been successful. She has no new complaints today. She and her husband again had questions about the need for and duration of long-term anti-convulsant therapy. Update 09/02/2016 ; She  returns for follow-up after last visit a year ago. She continues to do well without recurrent seizures now for 3 years. She is starting Keppra 500 mg twice daily without any side effects. She has recently started working as a Lawyer. She is very careful and regular with her sleeping and eating habits and avoids driving at night. She has no new neurological complaints today. She has had no new health problems since last 1 year. Update 11/12/2017 : She returns for follow-up after last visit a year ago. She continues to do well and hasn't not had recurrent seizures for greater than 3 years. She is tolerating Keppra 500 mg twice daily without any side effects. She has no neurological complaints no new health problems since last year. She plans to see her primary care physician for new complaints of knee pain which are not quite bothersome. Update 11/22/2018 : She returns for follow-up after last visit a year ago.  She is doing well and has not had recurrent seizures now for 4 years.  She remains on Keppra 500 mg twice daily which is tolerating well without any side effects.  She has no neurological complaints today.  She has  been having some chronic right-sided abdominal pain and saw her primary physician who has recently referred her to gastroenterologist that appointment has not yet happened.  She is working from home due to the coronavirus pandemic.   Update 11/07/2019 : She returns for follow-up after last visit a year ago.  She continues to do well and has not had any breakthrough seizures or aura now for greater than 5 years.  She remains on Keppra 500 mg twice daily which she seems to be tolerating well.  She is still working from home due to [employer].  She has had no new neurological or other health related issues.  She has no complaints today.  She states she has trouble abducting to change and has trouble sleeping if she is she is not sleeping at the same time in her own home recently she returned from  a long road trip felt her sleep was difficult.  No other complaints Updates 12/04/2020 : She returns for follow-up after last visit a year ago.  She continues to do well and not had any breakthrough seizures now for well over 6 years.  At last visit we reduced the dose of Keppra to 250 mg in the morning and 500 mg at night.  She admits that she often misses the a.m. dose and has been taking only find a milligram at night and not had any breakthrough seizures.  She does not have any neurological complaints.  Patient had some abdominal pain and has been found to have gallstones with a dilated biliary duct and had an ERCP done.  She is still complaining of a lot of burning in the mouth and advised her to discuss this with her gastroenterologist Dr. Opal Sidles.  She has been taking omeprazole for reflux symptoms Update 10/10/2021 ; she returns for follow-up after last visit 10 months ago.  She is doing well.  She has tolerated changing the immediate release Keppra to Keppra XR 500 mg daily.  She is still very rarely occasionally misses a dose..  She has had no breakthrough seizures now for nearly 7 years following her initial flurry of seizures at the onset in 2015.  She has seen gastroenterologist for herleft facial reflux disease and has been taking famotidine which seems to help.  She works full-time.  She has no complaints today. 10/23/2022 : She returns for follow-up after last visit 1 year ago.  She states she is doing well.  She has had no recurrent seizures now for 8 years.  She remains on Keppra XR 500 mg daily which is tolerating well without any side effects.  She has no neurological complaints.  She is doing well from medical standpoint as well except she has gastroesophageal reflux symptoms which seem well-controlled on famotidine.  She is working full-time.  She has no neurological complaints. ROS:   14 system review of systems is positive for mild gatro esophageal reflux symptoms only and all other systems  negative. PMH:  Past Medical History:  Diagnosis Date   GERD (gastroesophageal reflux disease)    Hyperactive    Lower half migraine    Seizures (HCC)     Social History:  Social History   Socioeconomic History   Marital status: Married    Spouse name: Not on file   Number of children: 2   Years of education: degree   Highest education level: Not on file  Occupational History   Occupation: school    Employer: UNEMPLOYED  Tobacco Use   Smoking  status: Never   Smokeless tobacco: Never  Substance and Sexual Activity   Alcohol use: No   Drug use: No   Sexual activity: Yes  Other Topics Concern   Not on file  Social History Narrative   Patient lives with her husband and 2 children   Patient is right handed    Social Determinants of Health   Financial Resource Strain: Not on file  Food Insecurity: Not on file  Transportation Needs: Not on file  Physical Activity: Not on file  Stress: Not on file  Social Connections: Unknown (08/09/2021)   Received from [facility]   Social Network    Social Network: Not on file  Intimate Partner Violence: Unknown (07/02/2021)   Received from [facility], [facility]   HITS    Physically Hurt: Not on file    Insult or Talk Down To: Not on file    Threaten Physical Harm: Not on file    Scream or Curse: Not on file    Medications:   Current Outpatient Medications on File Prior to Visit  Medication Sig Dispense Refill   acetaminophen (TYLENOL) 325 MG tablet Take 650 mg by mouth every 6 (six) hours as needed.     Cholecalciferol (VITAMIN D) 50 MCG (2000 UT) CAPS Take 1 each by mouth daily.     famotidine (PEPCID) 40 MG tablet Take by mouth.     ibuprofen (ADVIL,MOTRIN) 100 MG tablet Take 100 mg by mouth every 6 (six) hours as needed for fever.     levETIRAcetam (KEPPRA XR) 500 MG 24 hr tablet Take 1 tablet (500 mg total) by mouth daily. 90 tablet 3   No current facility-administered medications on file prior to  visit.    Allergies:   Allergies  Allergen Reactions   Levofloxacin Nausea And Vomiting    Other reaction(s): Other insomnia   Nexium [Esomeprazole] Nausea And Vomiting    Dizziness.     Physical Exam General: well developed, well nourished young  [place] lady, seated, in no evident distress Head: head normocephalic and atraumatic.   Neck: supple with no carotid or supraclavicular bruits Cardiovascular: regular rate and rhythm, no murmurs Musculoskeletal: no deformity Skin:  no rash/petichiae Vascular:  Normal pulses all extremities Vitals:   10/23/22 1420  BP: 92/61  Pulse: 68    Neurologic Exam Mental Status: Awake and fully alert. Oriented to place and time. Recent and remote memory intact. . Attention span, concentration and fund of knowledge appropriate. Mood and affect appropriate.  Cranial Nerves: Fundoscopic exam not done . Pupils equal, briskly reactive to light. Extraocular movements full without nystagmus. Visual fields full to confrontation. Hearing intact. Facial sensation intact. Face, tongue, palate moves normally and symmetrically.  Motor: Normal bulk and tone. Normal strength in all tested extremity muscles. Sensory.: intact to touch and pinprick and vibratory sensation.  Coordination: Rapid alternating movements normal in all extremities. Finger-to-nose and heel-to-shin performed accurately bilaterally. Gait and Station: Arises from chair without difficulty. Stance is normal. Gait demonstrates normal stride length and balance . Able to heel, toe and tandem walk with slight difficulty.  Reflexes: 1+ and symmetric. Toes downgoing.      ASSESSMENT: 66 year [place] lady with recent episodes of complex partial, simple partial and partial onset seizures with secondary generalization in June 2015 symptomatic from left frontal lesion etiology indeterminate. Adjacent venous angioma with  post ictal MRI changes from repetitive seizures. Seizures  seem well controlled  PLAN: I had a long discussion with the patient with regards to her history of remote seizures and recommend she continue taking Keppra XR 500 mg daily as she is tolerating it well and not having any side effects.  I advised her to avoid seizure provoking stimuli like medication noncompliance, regular sleeping and eating habits and extremes of exertion.  I also explained to her that she will likely need to be on seizure medications long-term due to her abnormal MRI which puts her at increased risk for seizure recurrence.  She was given refill for Keppra for a year.  She will return for follow-up in the future in a year or call earlier if necessary.Greater than 50% time during this 35 minute visit was spent on counseling and coordination of care about seizures and prevention of recurrence. Delia Heady, MD    Note: This document was prepared with digital dictation and possible smart phrase technology. Any transcriptional errors that result from this process are unintentional.

## 2022-10-23 NOTE — Patient Instructions (Signed)
I had a long discussion with the patient with regards to her history of remote seizures and recommend she continue taking Keppra XR 500 mg daily as she is tolerating it well and not having any side effects.  I advised her to avoid seizure provoking stimuli like medication noncompliance, regular sleeping and eating habits and extremes of exertion.  I also explained to her that she will likely need to be on seizure medications long-term due to her abnormal MRI which puts her at increased risk for seizure recurrence.  She was given refill for Keppra for a year.  She will return for follow-up in the future in a year or call earlier if necessary.

## 2022-10-27 ENCOUNTER — Telehealth: Payer: Self-pay | Admitting: Neurology

## 2022-10-27 MED ORDER — LEVETIRACETAM ER 500 MG PO TB24: 500.0000 mg | ORAL_TABLET | Freq: Every day | ORAL | 3 refills | Status: DC

## 2022-10-27 NOTE — Telephone Encounter (Signed)
Pt called and LVM stating that she would like to change all of her prescriptions over to [facility] on [address] from the [facility] on [address]. Please advise.

## 2022-10-27 NOTE — Telephone Encounter (Signed)
Returned pt's call to make sure we had the right pharmacy. Pt wanted her Keppra 500 mg Rx to be sent to the [facility] on [address].  Advised pt her Keppra Rx has been sent to the pharmacy she requested. Pt thanked me for calling.

## 2023-01-02 ENCOUNTER — Other Ambulatory Visit: Payer: Self-pay | Admitting: Family Medicine

## 2023-01-02 DIAGNOSIS — Z1211 Encounter for screening for malignant neoplasm of colon: Secondary | ICD-10-CM

## 2023-01-02 DIAGNOSIS — Z1212 Encounter for screening for malignant neoplasm of rectum: Secondary | ICD-10-CM

## 2023-10-22 ENCOUNTER — Ambulatory Visit: Payer: Commercial Managed Care - PPO | Admitting: Neurology

## 2023-10-29 ENCOUNTER — Ambulatory Visit: Payer: Commercial Managed Care - PPO | Admitting: Neurology

## 2023-11-02 ENCOUNTER — Encounter: Payer: Self-pay | Admitting: Neurology

## 2023-11-02 ENCOUNTER — Ambulatory Visit (INDEPENDENT_AMBULATORY_CARE_PROVIDER_SITE_OTHER): Admitting: Neurology

## 2023-11-02 VITALS — BP 90/60 | HR 68 | Ht 64.5 in | Wt 141.0 lb

## 2023-11-02 DIAGNOSIS — Z8669 Personal history of other diseases of the nervous system and sense organs: Secondary | ICD-10-CM

## 2023-11-02 DIAGNOSIS — D1802 Hemangioma of intracranial structures: Secondary | ICD-10-CM | POA: Diagnosis not present

## 2023-11-02 MED ORDER — LEVETIRACETAM ER 500 MG PO TB24: 500.0000 mg | ORAL_TABLET | Freq: Every day | ORAL | 3 refills | Status: AC

## 2023-11-02 NOTE — Patient Instructions (Signed)
 I had a long discussion with the patient with regards to her history of remote seizures and recommend she continue taking Keppra  XR 500 mg daily life long as she is tolerating it well and not having any side effects.  I advised her to avoid seizure provoking stimuli like medication noncompliance, regular sleeping and eating habits and extremes of exertion.  I also explained to her that she will likely need to be on seizure medications long-term due to her abnormal MRI which puts her at increased risk for seizure recurrence.  She was given refill for Keppra  for a year.  She will return for follow-up in the future in a year or call earlier if necessary

## 2023-11-02 NOTE — Progress Notes (Signed)
 [facility] [address] [place]. [place] [address] [phone]       OFFICE FOLLOW UP VISIT NOTE  Ms. Orlene Diss Date of Birth:  [birth year] Medical Record Number:  [id number]   Referring MD:  [employer]  Reason for Referral:  seizures  HPI: 81 year [place] lady who recently returned from [place] where she had  hospitalization for a repetitive seizures and abnormal MRI scan of the brain. She is accompanied today by husband and has brought with her extensive hospital records, several MRI scans and lab results which I have  personally reviewed. She started feeling sick   actually the first week of June 2015 several days prior to going to [place]. She was felt to have a sinus infection and treated with Augmentin for sinusitis and Zofran  for nausea. She felt tired after this and the day prior to her trip her husband noted that she looked pale and had head was turned to one side and she'll not fully responsive. EMS were called and patient vomited. She was taken to the long emergency room where she was felt to have near syncope and dehydration. She was treated with IV fluids. CT scan of the head, x-rays EKG and lab work were unremarkable. She was discharged on and felt better next morning after taking Sudafed. She traveled to [place] up with felt unwell. She started hearing voices in her head which were talking to each other. She is more able to concentrate and spent time with relatives. She felt better after she rested. Several days later after she had stayed up late in the night the husband noticed that she will have a generalized clonic tonic seizure. She was noted as morning with tossing and turning with teeth clenching. She was taken to the  [facility] hospital where she was diagnosed having seizures and treated with diazepam. She fell asleep. Next-day she was asked to see a neurologist who advised an MRI scan and EEG. She started having speech difficulties and  stopping  midsentence   and unable to complete sentences. The patient's husband [name] L4 medial which have looked at in which patient is making vocalizations but not able to form words. Patient was subsequently hospitalized and underwent a spinal tap which was reportedly normal and an MRI scan of the brain which showed weak diffusion positive lesion in the left medial frontal lobe cingulate gyrus. There were no ADC map images.This lesion was also seen on T2 and flair images and   a postcontrast MRI showed a venous angioma adjacent to this lesion. Lesion was felt to represent cortical dysplasia versus postictal changes from repititive seizures.. The patient was initially started on Trileptal and phenytoin but continued to have auditory hallucinations and some speech and word finding difficulties but eventually she was switched to Keppra  and she had a miraculous response to this and voices in her head stopped and her speech improved and she's not had any further seizures since then . She however has had some paranoid ideation and is having thoughts of fears of separation from her family and thoughts like ironing clothes we'll start a fire. She is currently taking Keppra  500 twice daily and Trileptal 300 twice daily. She had lab work done on 10/18/13 which showed low sodium of 133 but normal WBC and electrolytes and liver functions otherwise. Update 12/30/2013 : She returns for followup after last visit 3 months ago. She is doing well and has not had any recurrent seizures. She has tapered and  discontinued Trileptal. She is currently on Keppra  500 twice daily. She does complain of some excessive sleepiness and tiredness. She is continues to have mild short-term memory and cognitive difficulties but some of these 3 seated her seizures. Husband is concerned that at times she forgets recent conversations and events. She had EEG done which I have personally reviewed and was normal. A followup MRI scan of the brain done on 12/08/13  personally reviewed shows left frontal small venous angioma without evidence of significant hemorrhage. The left cingulate gyrus swelling and cytotoxic edema have completely resolved compared with previous MRI from June 2015 from [place] Update 08/11/2014 : She returns for follow-up after last visit 7 months ago. She continues to do well without any recurrent seizures. She has been quite compliant with Keppra  which she is taking regularly. She is tolerating it well without significant side effects. She does complain of slight fatigue as well as palpitations but this may be related to underlying anxiety and some element of posttraumatic stress. She has not been participating in any regular activities for stress relaxation. Update 02/21/2015 : She returns for follow-up after last visit 6 months ago. She is accompanied by husband. She continues to do well without recurrent seizures. She has been compliant with Keppra  which she is taking regularly and tolerating it quite well. She continues to have slight fatigue as well as anxiety. She has not been participating in regular activities for stress laxation. She has questions about how long she needs to stay on Keppra  and I spent a lot of time counseling patient and her husband the need for long-term anticonvulsants given the fact she had multiple seizures Update 08/20/2015 : She returns for follow-up after last visit 6 months ago. She is accompanied by her husband. She is doing well and has not had recurrent seizures now for nearly 2 years. The she is tolerating Keppra  500 mg twice daily quite well without any side effects. Did have a transient episode of pain in the left hand and does have occasional knee and joint pains but otherwise is doing quite well. She wants to lose weight but has not been successful. She has no new complaints today. She and her husband again had questions about the need for and duration of long-term anti-convulsant therapy. Update 09/02/2016 ; She  returns for follow-up after last visit a year ago. She continues to do well without recurrent seizures now for 3 years. She is starting Keppra  500 mg twice daily without any side effects. She has recently started working as a Lawyer. She is very careful and regular with her sleeping and eating habits and avoids driving at night. She has no new neurological complaints today. She has had no new health problems since last 1 year. Update 11/12/2017 : She returns for follow-up after last visit a year ago. She continues to do well and hasn't not had recurrent seizures for greater than 3 years. She is tolerating Keppra  500 mg twice daily without any side effects. She has no neurological complaints no new health problems since last year. She plans to see her primary care physician for new complaints of knee pain which are not quite bothersome. Update 11/22/2018 : She returns for follow-up after last visit a year ago.  She is doing well and has not had recurrent seizures now for 4 years.  She remains on Keppra  500 mg twice daily which is tolerating well without any side effects.  She has no neurological complaints today.  She has  been having some chronic right-sided abdominal pain and saw her primary physician who has recently referred her to gastroenterologist that appointment has not yet happened.  She is working from home due to the coronavirus pandemic.   Update 11/07/2019 : She returns for follow-up after last visit a year ago.  She continues to do well and has not had any breakthrough seizures or aura now for greater than 5 years.  She remains on Keppra  500 mg twice daily which she seems to be tolerating well.  She is still working from home due to [employer].  She has had no new neurological or other health related issues.  She has no complaints today.  She states she has trouble abducting to change and has trouble sleeping if she is she is not sleeping at the same time in her own home recently she returned from  a long road trip felt her sleep was difficult.  No other complaints Updates 12/04/2020 : She returns for follow-up after last visit a year ago.  She continues to do well and not had any breakthrough seizures now for well over 6 years.  At last visit we reduced the dose of Keppra  to 250 mg in the morning and 500 mg at night.  She admits that she often misses the a.m. dose and has been taking only find a milligram at night and not had any breakthrough seizures.  She does not have any neurological complaints.  Patient had some abdominal pain and has been found to have gallstones with a dilated biliary duct and had an ERCP done.  She is still complaining of a lot of burning in the mouth and advised her to discuss this with her gastroenterologist Dr. Leverne.  She has been taking omeprazole for reflux symptoms Update 10/10/2021 ; she returns for follow-up after last visit 10 months ago.  She is doing well.  She has tolerated changing the immediate release Keppra  to Keppra  XR 500 mg daily.  She is still very rarely occasionally misses a dose..  She has had no breakthrough seizures now for nearly 7 years following her initial flurry of seizures at the onset in 2015.  She has seen gastroenterologist for her gastroesophageal reflux disease and has been taking famotidine  which seems to help.  She works full-time.  She has no complaints today. 10/23/2022 : She returns for follow-up after last visit 1 year ago.  She states she is doing well.  She has had no recurrent seizures now for 8 years.  She remains on Keppra  XR 500 mg daily which is tolerating well without any side effects.  She has no neurological complaints.  She is doing well from medical standpoint as well except she has gastroesophageal reflux symptoms which seem well-controlled on famotidine .  She is working full-time.  She has no neurological complaints. Update 11/02/2023 : She returns for follow-up after last visit a year ago.  She continues to do well.  She has  not no breakthrough seizures now for nearly 10 years.  She is very compliant with taking her Keppra .  She remains on Keppra  XR 500 mg daily she is tolerating well without any side effects.  She does not miss a single dose.  She has no complaints today.  She continues to have gastroesophageal reflux issues for which she takes Pepcid  which seems to help.  She continues to work full-time.  She has had no new health issues.  She has no complaints today. ROS:   14 system review of  systems is positive for mild gatro esophageal reflux symptoms only and all other systems negative. PMH:  Past Medical History:  Diagnosis Date   GERD (gastroesophageal reflux disease)    Hyperactive    Lower half migraine    Seizures (HCC)     Social History:  Social History   Socioeconomic History   Marital status: Married    Spouse name: Not on file   Number of children: 2   Years of education: degree   Highest education level: Not on file  Occupational History   Occupation: school    Employer: UNEMPLOYED  Tobacco Use   Smoking status: Never   Smokeless tobacco: Never  Vaping Use   Vaping status: Never Used  Substance and Sexual Activity   Alcohol use: No   Drug use: No   Sexual activity: Yes  Other Topics Concern   Not on file  Social History Narrative   Patient lives with her husband and 2 children   Patient is right handed    Social Drivers of Corporate investment banker Strain: Not on file  Food Insecurity: Not on file  Transportation Needs: Not on file  Physical Activity: Not on file  Stress: Not on file  Social Connections: Unknown (08/09/2021)   [facility] from [facility]   Social Network    Social Network: Not on file  Intimate Partner Violence: Unknown (07/02/2021)   Received from [facility]   HITS    Physically Hurt: Not on file    Insult or Talk Down To: Not on file    Threaten Physical Harm: Not on file    Scream or Curse: Not on file    Medications:   Current Outpatient  Medications on File Prior to Visit  Medication Sig Dispense Refill   acetaminophen (TYLENOL) 325 MG tablet Take 650 mg by mouth every 6 (six) hours as needed.     Cholecalciferol (VITAMIN D ) 50 MCG (2000 UT) CAPS Take 1 each by mouth daily.     famotidine  (PEPCID ) 40 MG tablet Take by mouth.     ibuprofen (ADVIL,MOTRIN) 100 MG tablet Take 100 mg by mouth every 6 (six) hours as needed for fever.     levETIRAcetam  (KEPPRA  XR) 500 MG 24 hr tablet Take 1 tablet (500 mg total) by mouth daily. 90 tablet 3   No current facility-administered medications on file prior to visit.    Allergies:   Allergies  Allergen Reactions   Levofloxacin Nausea And Vomiting    Other reaction(s): Other insomnia   Nexium [Esomeprazole] Nausea And Vomiting    Dizziness.     Physical Exam General: well developed, well nourished young  [place] lady, seated, in no evident distress Head: head normocephalic and atraumatic.   Neck: supple with no carotid or supraclavicular bruits Cardiovascular: regular rate and rhythm, no murmurs Musculoskeletal: no deformity Skin:  no rash/petichiae Vascular:  Normal pulses all extremities Vitals:   11/02/23 1514  BP: 90/60  Pulse: 68    Neurologic Exam Mental Status: Awake and fully alert. Oriented to place and time. Recent and remote memory intact. . Attention span, concentration and fund of knowledge appropriate. Mood and affect appropriate.  Cranial Nerves: Fundoscopic exam not done . Pupils equal, briskly reactive to light. Extraocular movements full without nystagmus. Visual fields full to confrontation. Hearing intact. Facial sensation intact. Face, tongue, palate moves normally and symmetrically.  Motor: Normal bulk and tone. Normal strength in all tested extremity muscles. Sensory.: intact to touch  and pinprick and vibratory sensation.  Coordination: Rapid alternating movements normal in all extremities. Finger-to-nose and heel-to-shin performed accurately  bilaterally. Gait and Station: Arises from chair without difficulty. Stance is normal. Gait demonstrates normal stride length and balance . Able to heel, toe and tandem walk with slight difficulty.  Reflexes: 1+ and symmetric. Toes downgoing.      ASSESSMENT: 61 year [place] lady with recent episodes of complex partial, simple partial and partial onset seizures with secondary generalization in June 2015 symptomatic from left frontal lesion etiology indeterminate. Adjacent venous angioma with  post ictal MRI changes from repetitive seizures. Seizures seem well controlled    PLAN: I had a long discussion with the patient with regards to her history of remote seizures and recommend she continue taking Keppra  XR 500 mg daily life long as she is tolerating it well and not having any side effects.  I advised her to avoid seizure provoking stimuli like medication noncompliance, regular sleeping and eating habits and extremes of exertion.  I also explained to her that she will likely need to be on seizure medications long-term due to her abnormal MRI which puts her at increased risk for seizure recurrence.  She was given refill for Keppra  for a year.  She will return for follow-up in the future in a year or call earlier if necessary.Greater than 50% time during this 35 minute visit was spent on counseling and coordination of care about seizures and prevention of recurrence. .  I personally spent a total of 35 minutes in the care of the patient today including getting/reviewing separately obtained history, performing a medically appropriate exam/evaluation, counseling and educating, placing orders, referring and communicating with other health care professionals, documenting clinical information in the EHR, independently interpreting results, and coordinating care.        Eather Popp, MD    Note: This document was prepared with digital dictation and possible smart phrase technology. Any  transcriptional errors that result from this process are unintentional.

## 2023-12-31 ENCOUNTER — Encounter: Payer: Self-pay | Admitting: Podiatry

## 2023-12-31 ENCOUNTER — Ambulatory Visit: Admitting: Podiatry

## 2023-12-31 ENCOUNTER — Ambulatory Visit

## 2023-12-31 DIAGNOSIS — S93311A Subluxation of tarsal joint of right foot, initial encounter: Secondary | ICD-10-CM | POA: Diagnosis not present

## 2023-12-31 DIAGNOSIS — M7751 Other enthesopathy of right foot: Secondary | ICD-10-CM

## 2023-12-31 NOTE — Progress Notes (Signed)
       Chief Complaint  Patient presents with   Foot Pain     Rm5 Right foot lateral foot pain/no injury/ burning and constant pain/ otc pain creams    HPI: 53 y.o. female presents today pain to right lateral midfoot.  It has been ongoing for several months.  She thinks that she may have rolled her foot or ankle preceding this but she is unsure.  Painful with shoe gear and weightbearing activity.  Past Medical History:  Diagnosis Date   GERD (gastroesophageal reflux disease)    Hyperactive    Lower half migraine    Seizures (HCC)     History reviewed. No pertinent surgical history.  Allergies  Allergen Reactions   Levofloxacin Nausea And Vomiting    Other reaction(s): Other insomnia   Nexium [Esomeprazole] Nausea And Vomiting    Dizziness.     ROS    Physical Exam: There were no vitals filed for this visit.  General: The patient is alert and oriented x3 in no acute distress.  Dermatology: Skin is warm, dry and supple bilateral lower extremities. Interspaces are clear of maceration and debris.    Vascular: Palpable pedal pulses bilaterally. Capillary refill within normal limits.  No appreciable edema.  No erythema or calor.  Neurological: Light touch sensation grossly intact bilateral feet.   Musculoskeletal Exam: Muscle strength 5/5 for all major muscle groups.  Some tenderness on palpation of right cuboid and 4th and 5th TMT J regions dorsal lateral aspect.  Some tenderness on palpation to [facility] and EDB muscle bellies.  Some subjective relief with cuboid massage  Radiographic Exam: Right foot 3 views weightbearing 12/31/2023 Normal osseous mineralization. Joint spaces preserved.  No fractures or osseous irregularities noted.  Increased talar head uncoverage noted and forefoot abduction  Assessment/Plan of Care: 1. Cuboid syndrome of right foot      No orders of the defined types were placed in this encounter.  None  Discussed clinical findings with patient  today.  Radiographs reviewed with patient.  Findings seem consistent with right cuboid syndrome.  Demonstrated cuboid massage technique, instructed patient to perform this maneuver 2-3 sets of 15-20 repetitions in each direction 2-3 times a day.  Discussed RICE therapy Over-the-counter naproxen 220-440 mg 1-2 times a day as needed for the next 2 to 3 weeks Power steps dispensed the patient today with lateral wedge fashion for the right insole using adhesive felt padding.  Did dispense extra felt padding for contralateral side if necessary  Reevaluate in 3 to 4 weeks.  Levette Paulick L. Lamount MAUL, AACFAS [facility]     [address] [place], [place] [address]                Office [phone]  Fax [phone]

## 2024-02-13 ENCOUNTER — Other Ambulatory Visit: Payer: Self-pay | Admitting: Neurology

## 2024-02-16 NOTE — Telephone Encounter (Signed)
 Last seen on 11/02/23 Follow up scheduled on 11/14/24

## 2024-11-14 ENCOUNTER — Ambulatory Visit: Admitting: Neurology
